# Patient Record
Sex: Male | Born: 1955 | Race: White | Hispanic: No | Marital: Married | Smoking: Never smoker
Health system: Southern US, Community
[De-identification: ages and names within clinical notes are randomized; demographics above are authoritative.]

## PROBLEM LIST (undated history)

## (undated) DIAGNOSIS — J301 Allergic rhinitis due to pollen: Secondary | ICD-10-CM

## (undated) DIAGNOSIS — I10 Essential (primary) hypertension: Secondary | ICD-10-CM

## (undated) DIAGNOSIS — K219 Gastro-esophageal reflux disease without esophagitis: Secondary | ICD-10-CM

## (undated) DIAGNOSIS — E782 Mixed hyperlipidemia: Secondary | ICD-10-CM

## (undated) HISTORY — DX: Essential (primary) hypertension: I10

## (undated) HISTORY — DX: Allergic rhinitis due to pollen: J30.1

## (undated) HISTORY — PX: TONSILLECTOMY: SUR1361

## (undated) HISTORY — DX: Mixed hyperlipidemia: E78.2

## (undated) HISTORY — DX: Gastro-esophageal reflux disease without esophagitis: K21.9

---

## 2006-02-22 ENCOUNTER — Ambulatory Visit: Payer: Self-pay | Admitting: Family Medicine

## 2006-04-10 ENCOUNTER — Ambulatory Visit: Payer: Self-pay | Admitting: Family Medicine

## 2007-02-25 ENCOUNTER — Ambulatory Visit: Payer: Self-pay | Admitting: Family Medicine

## 2007-02-25 DIAGNOSIS — K219 Gastro-esophageal reflux disease without esophagitis: Secondary | ICD-10-CM | POA: Insufficient documentation

## 2007-02-25 DIAGNOSIS — I10 Essential (primary) hypertension: Secondary | ICD-10-CM | POA: Insufficient documentation

## 2007-02-25 DIAGNOSIS — E782 Mixed hyperlipidemia: Secondary | ICD-10-CM | POA: Insufficient documentation

## 2007-02-25 HISTORY — DX: Essential (primary) hypertension: I10

## 2007-02-25 HISTORY — DX: Gastro-esophageal reflux disease without esophagitis: K21.9

## 2007-02-25 HISTORY — DX: Mixed hyperlipidemia: E78.2

## 2007-02-25 LAB — CONVERTED CEMR LAB
Glucose, Urine, Semiquant: NEGATIVE
Specific Gravity, Urine: 1.025
WBC Urine, dipstick: NEGATIVE
pH: 6

## 2007-03-12 ENCOUNTER — Encounter: Payer: Self-pay | Admitting: Family Medicine

## 2007-03-12 ENCOUNTER — Ambulatory Visit: Payer: Self-pay | Admitting: Family Medicine

## 2007-03-12 LAB — CONVERTED CEMR LAB
Albumin: 4.2 g/dL (ref 3.5–5.2)
BUN: 16 mg/dL (ref 6–23)
Calcium: 9.2 mg/dL (ref 8.4–10.5)
Chloride: 104 meq/L (ref 96–112)
Glucose, Bld: 94 mg/dL (ref 70–99)
HDL: 37 mg/dL — ABNORMAL LOW (ref >39)
PSA: 0.35 ng/mL (ref 0.10–4.00)
Potassium: 4.5 meq/L (ref 3.5–5.3)
Total Protein: 7 g/dL (ref 6.0–8.3)
Triglycerides: 204 mg/dL — ABNORMAL HIGH (ref <150)

## 2007-03-14 ENCOUNTER — Encounter: Payer: Self-pay | Admitting: Family Medicine

## 2007-05-20 ENCOUNTER — Ambulatory Visit: Payer: Self-pay | Admitting: Family Medicine

## 2007-05-24 ENCOUNTER — Encounter: Payer: Self-pay | Admitting: Family Medicine

## 2007-05-24 ENCOUNTER — Encounter: Admission: RE | Admit: 2007-05-24 | Discharge: 2007-05-24 | Payer: Self-pay | Admitting: Family Medicine

## 2007-06-11 ENCOUNTER — Telehealth: Payer: Self-pay | Admitting: Family Medicine

## 2007-08-05 ENCOUNTER — Telehealth: Payer: Self-pay | Admitting: *Deleted

## 2007-08-07 ENCOUNTER — Telehealth: Payer: Self-pay | Admitting: *Deleted

## 2007-09-03 ENCOUNTER — Encounter: Payer: Self-pay | Admitting: Family Medicine

## 2007-09-19 ENCOUNTER — Encounter: Payer: Self-pay | Admitting: Family Medicine

## 2008-03-02 ENCOUNTER — Telehealth: Payer: Self-pay | Admitting: Family Medicine

## 2008-03-03 ENCOUNTER — Ambulatory Visit: Payer: Self-pay | Admitting: Family Medicine

## 2009-03-01 ENCOUNTER — Telehealth: Payer: Self-pay | Admitting: Family Medicine

## 2009-03-22 ENCOUNTER — Ambulatory Visit: Payer: Self-pay | Admitting: Family Medicine

## 2009-03-22 DIAGNOSIS — N401 Enlarged prostate with lower urinary tract symptoms: Secondary | ICD-10-CM | POA: Insufficient documentation

## 2009-03-29 ENCOUNTER — Telehealth: Payer: Self-pay | Admitting: *Deleted

## 2009-03-31 ENCOUNTER — Ambulatory Visit: Payer: Self-pay | Admitting: Family Medicine

## 2009-03-31 LAB — CONVERTED CEMR LAB
ALT: 26 units/L (ref 0–53)
Albumin: 4.4 g/dL (ref 3.5–5.2)
BUN: 18 mg/dL (ref 6–23)
CO2: 25 meq/L (ref 19–32)
Calcium: 9.3 mg/dL (ref 8.4–10.5)
Chloride: 103 meq/L (ref 96–112)
Cholesterol: 174 mg/dL (ref 0–200)
Creatinine, Ser: 1.04 mg/dL (ref 0.40–1.50)
PSA: 0.31 ng/mL (ref 0.10–4.00)
Potassium: 4.4 meq/L (ref 3.5–5.3)
Total CHOL/HDL Ratio: 4.7

## 2009-04-05 ENCOUNTER — Encounter: Payer: Self-pay | Admitting: Family Medicine

## 2009-10-21 ENCOUNTER — Encounter: Payer: Self-pay | Admitting: Family Medicine

## 2009-11-17 ENCOUNTER — Ambulatory Visit: Payer: Self-pay | Admitting: Family Medicine

## 2009-12-15 ENCOUNTER — Ambulatory Visit: Payer: Self-pay | Admitting: Family Medicine

## 2010-04-06 ENCOUNTER — Ambulatory Visit: Payer: Self-pay | Admitting: Family Medicine

## 2010-04-06 LAB — CONVERTED CEMR LAB
ALT: 41 units/L (ref 0–53)
Albumin: 4.4 g/dL (ref 3.5–5.2)
CO2: 24 meq/L (ref 19–32)
Calcium: 9.1 mg/dL (ref 8.4–10.5)
Chloride: 102 meq/L (ref 96–112)
Cholesterol: 190 mg/dL (ref 0–200)
Glucose, Bld: 100 mg/dL — ABNORMAL HIGH (ref 70–99)
Potassium: 4.3 meq/L (ref 3.5–5.3)
Sodium: 138 meq/L (ref 135–145)
Total Bilirubin: 0.7 mg/dL (ref 0.3–1.2)
Total Protein: 6.9 g/dL (ref 6.0–8.3)
Triglycerides: 161 mg/dL — ABNORMAL HIGH (ref <150)

## 2010-04-07 ENCOUNTER — Encounter: Payer: Self-pay | Admitting: Family Medicine

## 2010-06-07 NOTE — Miscellaneous (Signed)
Summary: Scuba Diving applicatiosn  Clinical Lists Changes James Watts would like to have form completed no later than Monday.  He will pick up whn ready. James Watts  October 21, 2009 9:10 AM  form to pcp.Golden Circle RN  October 21, 2009 9:51 AM  Completed - Form to Fara Boros MD  October 21, 2009 2:41 PM  pt states he will come get it monday.placed up front .Golden Circle RN  October 21, 2009 3:51 PM

## 2010-06-07 NOTE — Assessment & Plan Note (Signed)
Summary: ear problem,tcb   Vital Signs:  Patient profile:   55 year old male Height:      71.5 inches Weight:      252.1 pounds BMI:     34.80 Temp:     98.2 degrees F oral Pulse rate:   73 / minute BP sitting:   127 / 76  (left arm) Cuff size:   regular  Vitals Entered By: Garen Grams LPN (December 15, 2009 4:21 PM) CC: still having right ear pain Is Patient Diabetic? No Pain Assessment Patient in pain? yes     Location: r ear   CC:  still having right ear pain.  History of Present Illness: 1) Right Ear Pain: Treated for acute otitis externa right ear 11/17/09 w/ corticosporin ear drops with improvement. Symptoms of ear pain and mild discharge returned a few days ago. Has been swimming frequently. Also dives but has not done so since June 2011.   ROS - Denies fever, chills, nausea, emesis, hearing loss.   PMH - Medications reviewed and updated in medication list.  Smoking Status noted in VS form    Habits & Providers  Alcohol-Tobacco-Diet     Tobacco Status: never  Medications Prior to Update: 1)  Finasteride 5 Mg Tabs (Finasteride) .... Take 1 Tablet By Mouth Once A Day 2)  Fluticasone Propionate 50 Mcg/act Susp (Fluticasone Propionate) .... Spray 2 Spray Into Both Nostrils Once A Day. 1 Mdi 3)  Lipitor 20 Mg Tabs (Atorvastatin Calcium) .... Take 1 Tablet By Mouth Once A Day 4)  Lisinopril 10 Mg Tabs (Lisinopril) .... Take 1 Tablet By Mouth Once A Day 5)  Viagra 50 Mg Tabs (Sildenafil Citrate) .... Take 1 Tablet By Mouth As Directed 6)  Omeprazole 20 Mg Cpdr (Omeprazole) .... 2 A Day 7)  Flovent Hfa 110 Mcg/act  Aero (Fluticasone Propionate  Hfa) .Marland Kitchen.. 1 Puff Two Times A Day Prior To Allergy Season 1 Mdi 8)  Albuterol 90 Mcg/act  Aers (Albuterol) .Marland Kitchen.. 1-2 Puff As Needed Do Not Use More Than 4 X /day 1 Mdi 9)  Aspirin 81 Mg Tabs (Aspirin) .Marland Kitchen.. 1 Daily 10)  Gnp Fish Oil 1000 Mg Caps (Omega-3 Fatty Acids) .Marland Kitchen.. 1 Daily 11)  Cortisporin-Tc 3.07-08-08-0.5 Mg/ml Susp  (Neomycin-Colist-Hc-Thonzonium) .... 2-3 Drops 4 X A Day To R Ear After Ear Cleaning 1 Bottle  Allergies (verified): 1)  Cefaclor (Cefaclor)  Physical Exam  General:  Well-developed,well-nourished,in no acute distress; alert,appropriate and cooperative throughout examination Ears:  L ear - normal R canal - erythematous and mildly swollen with whitish debris, mildly.  TM poorly seen but appears clear w/o perforation.    Impression & Recommendations:  Problem # 1:  OTITIS EXTERNA (ICD-380.10) Assessment Deteriorated  Will treat with drops as below. Follow up with PCP as needed. Ear care reviewed.   His updated medication list for this problem includes:    Cortisporin-tc 3.07-08-08-0.5 Mg/ml Susp (Neomycin-colist-hc-thonzonium) .Marland Kitchen... 2-3 drops 4 x a day to r ear after ear cleaning 1 bottle   Orders: [facility]- Est Level  3 ([address]562)  Orders: [facility]- Est Level  3 (13086)  Complete Medication List: 1)  Finasteride 5 Mg Tabs (Finasteride) .... Take 1 tablet by mouth once a day 2)  Fluticasone Propionate 50 Mcg/act Susp (Fluticasone propionate) .... Spray 2 spray into both nostrils once a day. 1 mdi 3)  Lipitor 20 Mg Tabs (Atorvastatin calcium) .... Take 1 tablet by mouth once a day 4)  Lisinopril 10 Mg Tabs (Lisinopril) .... Take  1 tablet by mouth once a day 5)  Viagra 50 Mg Tabs (Sildenafil citrate) .... Take 1 tablet by mouth as directed 6)  Omeprazole 20 Mg Cpdr (Omeprazole) .... 2 a day 7)  Flovent Hfa 110 Mcg/act Aero (Fluticasone propionate  hfa) .Marland Kitchen.. 1 puff two times a day prior to allergy season 1 mdi 8)  Albuterol 90 Mcg/act Aers (Albuterol) .Marland Kitchen.. 1-2 puff as needed do not use more than 4 x /day 1 mdi 9)  Aspirin 81 Mg Tabs (Aspirin) .Marland Kitchen.. 1 daily 10)  Gnp Fish Oil 1000 Mg Caps (Omega-3 fatty acids) .Marland Kitchen.. 1 daily 11)  Cortisporin-tc 3.07-08-08-0.5 Mg/ml Susp (Neomycin-colist-hc-thonzonium) .... 2-3 drops 4 x a day to r ear after ear cleaning 1 bottle

## 2010-06-07 NOTE — Assessment & Plan Note (Signed)
Summary: cpe,df   Vital Signs:  Patient profile:   55 year old male Height:      71.5 inches Weight:      252.13 pounds BMI:     34.80 BSA:     2.34 Temp:     98.6 degrees F Pulse rate:   75 / minute BP sitting:   128 / 78  Vitals Entered By: Jone Baseman CMA (April 06, 2010 8:37 AM) CC: CPE Is Patient Diabetic? No Pain Assessment Patient in pain? no        CC:  CPE.  History of Present Illness: CPE feels well no complaints  ROS - as above PMH - Medications reviewed and updated in medication list.  Smoking Status noted in VS form    Habits & Providers  Alcohol-Tobacco-Diet     Tobacco Status: never  Current Medications (verified): 1)  Finasteride 5 Mg Tabs (Finasteride) .... Take 1 Tablet By Mouth Once A Day 2)  Fluticasone Propionate 50 Mcg/act Susp (Fluticasone Propionate) .... Spray 2 Spray Into Both Nostrils Once A Day. 1 Mdi 3)  Lipitor 20 Mg Tabs (Atorvastatin Calcium) .... Take 1 Tablet By Mouth Once A Day 4)  Lisinopril 10 Mg Tabs (Lisinopril) .... Take 1 Tablet By Mouth Once A Day 5)  Viagra 50 Mg Tabs (Sildenafil Citrate) .... Take 1 Tablet By Mouth As Directed 6)  Omeprazole 20 Mg Cpdr (Omeprazole) .... 2 A Day 7)  Flovent Hfa 110 Mcg/act  Aero (Fluticasone Propionate  Hfa) .Marland Kitchen.. 1 Puff Two Times A Day Prior To Allergy Season 1 Mdi 8)  Albuterol 90 Mcg/act  Aers (Albuterol) .Marland Kitchen.. 1-2 Puff As Needed Do Not Use More Than 4 X /day 1 Mdi 9)  Aspirin 81 Mg Tabs (Aspirin) .Marland Kitchen.. 1 Daily 10)  Gnp Fish Oil 1000 Mg Caps (Omega-3 Fatty Acids) .Marland Kitchen.. 1 Daily  Allergies: 1)  Cefaclor (Cefaclor)  Review of Systems  The patient denies anorexia, fever, weight loss, vision loss, decreased hearing, hoarseness, chest pain, syncope, dyspnea on exertion, peripheral edema, prolonged cough, headaches, hemoptysis, abdominal pain, melena, hematochezia, severe indigestion/heartburn, hematuria, incontinence, genital sores, muscle weakness, suspicious skin lesions, transient  blindness, difficulty walking, depression, unusual weight change, abnormal bleeding, enlarged lymph nodes, angioedema, and testicular masses.    Physical Exam  General:  Well-developed,well-nourished,in no acute distress; alert,appropriate and cooperative throughout examination Head:  Normocephalic and atraumatic without obvious abnormalities. No apparent alopecia or balding. Ears:  External ear exam shows no significant lesions or deformities.  Otoscopic examination reveals clear canals, tympanic membranes are intact bilaterally without bulging, retraction, inflammation or discharge. Hearing is grossly normal bilaterally. Mouth:  Oral mucosa and oropharynx without lesions or exudates.  Teeth in good repair. Neck:  No deformities, masses, or tenderness noted. Lungs:  Normal respiratory effort, chest expands symmetrically. Lungs are clear to auscultation, no crackles or wheezes. Heart:  Normal rate and regular rhythm. S1 and S2 normal without gallop, murmur, click, rub or other extra sounds. Abdomen:  Bowel sounds positive,abdomen soft and non-tender without masses, organomegaly or hernias noted. Rectal:  No external abnormalities noted. Normal sphincter tone. No rectal masses or tenderness. Prostate:  Prostate gland firm and smooth, no enlargement, nodularity, tenderness, mass, asymmetry or induration. Msk:  No deformity or scoliosis noted of thoracic or lumbar spine.   Extremities:  No clubbing, cyanosis, edema, or deformity noted with normal full range of motion of all joints.   Skin:  Intact without suspicious lesions or rashes Cervical Nodes:  No lymphadenopathy noted  Psych:  Cognition and judgment appear intact. Alert and cooperative with normal attention span and concentration. No apparent delusions, illusions, hallucinations   Impression & Recommendations:  Problem # 1:  PREVENTIVE HEALTH CARE (ICD-V70.0)  normal exam.  up to date on screening  Orders: [facility] - Est  40-64 yrs  ([address])  Problem # 2:  GERD (ICD-530.1) Assessment: Unchanged stable.  No red flags of bleeding or dysphagia or weight loss   Problem # 3:  HYPERTENSION (ICD-401.9)  stable  His updated medication list for this problem includes:    Lisinopril 10 Mg Tabs (Lisinopril) .Marland Kitchen... Take 1 tablet by mouth once a day  Orders: Comp Met-[facility] ([id number])  BP today: 128/78 Prior BP: 127/76 (12/15/2009)  Labs Reviewed: K+: 4.4 (03/31/2009) Creat: : 1.04 (03/31/2009)   Chol: 174 (03/31/2009)   HDL: 37 (03/31/2009)   LDL: 102 (03/31/2009)   TG: 177 (03/31/2009)  Problem # 4:  HYPERLIPIDEMIA (ICD-272.2) check labs  His updated medication list for this problem includes:    Lipitor 20 Mg Tabs (Atorvastatin calcium) .Marland Kitchen... Take 1 tablet by mouth once a day  Orders: Lipid-[facility] ([id number])  Problem # 5:  HYPERTROPHY PROSTATE W/UR OBST & OTH LUTS (ICD-600.01) stable symptoms check psa  Complete Medication List: 1)  Finasteride 5 Mg Tabs (Finasteride) .... Take 1 tablet by mouth once a day 2)  Fluticasone Propionate 50 Mcg/act Susp (Fluticasone propionate) .... Spray 2 spray into both nostrils once a day. 1 mdi 3)  Lipitor 20 Mg Tabs (Atorvastatin calcium) .... Take 1 tablet by mouth once a day 4)  Lisinopril 10 Mg Tabs (Lisinopril) .... Take 1 tablet by mouth once a day 5)  Viagra 50 Mg Tabs (Sildenafil citrate) .... Take 1 tablet by mouth as directed 6)  Omeprazole 20 Mg Cpdr (Omeprazole) .... 2 a day 7)  Flovent Hfa 110 Mcg/act Aero (Fluticasone propionate  hfa) .Marland Kitchen.. 1 puff two times a day prior to allergy season 1 mdi 8)  Albuterol 90 Mcg/act Aers (Albuterol) .Marland Kitchen.. 1-2 puff as needed do not use more than 4 x /day 1 mdi 9)  Aspirin 81 Mg Tabs (Aspirin) .Marland Kitchen.. 1 daily 10)  Gnp Fish Oil 1000 Mg Caps (Omega-3 fatty acids) .Marland Kitchen.. 1 daily  Other Orders: PSA-[facility] ([id number])  Patient Instructions: 1)  Please schedule a follow-up appointment in 1 year.  2)  I will call you if your lab is abnormal  otherwise I will send you a letter within 2 weeks. 3)  You major health task is weight control.  I agree portion control and exercise are the keys.   Aim to lose 2 lb per week.   If you would like to meet with our Health Coach or Nutritiionist please contact us Prescriptions: LISINOPRIL 10 MG TABS (LISINOPRIL) Take 1 tablet by mouth once a day  #90 x 3   Entered and Authorized by:   Pearlean Brownie MD   Signed by:   Pearlean Brownie MD on 04/06/2010   Method used:   Faxed to ...       [facility] (mail-order)             , [place]         Ph: [phone]       Fax: [phone]   RxID:   [id number] LIPITOR 20 MG TABS (ATORVASTATIN CALCIUM) Take 1 tablet by mouth once a day  #90 x 3   Entered and Authorized by:   Pearlean Brownie MD   Signed by:   Gaynell Face  Tinea Nobile MD on 04/06/2010   Method used:   Faxed to ...       [facility] (mail-order)             , [place]         Ph: [phone]       Fax: [phone]   RxID:   [id number] FINASTERIDE 5 MG TABS (FINASTERIDE) Take 1 tablet by mouth once a day  #14 x 0   Entered and Authorized by:   Pearlean Brownie MD   Signed by:   Pearlean Brownie MD on 04/06/2010   Method used:   [facility]        [facility] [id number]       [address]       [place], [place]  [address]       Ph: [phone] or [phone]       Fax: [phone]   RxID:   [id number]    Orders Added: 1)  Comp Met-[facility] [[id number]] 2)  Lipid-[facility] [[id number]] 3)  PSA-[facility] [phone] 4)  [facility] - Est  40-64 yrs [[id number]]     Prevention & Chronic Care Immunizations   Influenza vaccine: Fluvax 3+  (03/22/2009)    Tetanus booster: 03/22/2009: Tdap    Pneumococcal vaccine: Not documented  Colorectal Screening   Hemoccult: Not documented   Hemoccult action/deferral: Not indicated  (03/22/2009)    Colonoscopy: 8 milm polyp   (09/19/2007)  Other Screening   PSA: 0.31  (03/31/2009)   PSA ordered.   Smoking status: never   (04/06/2010)  Lipids   Total Cholesterol: 174  (03/31/2009)   LDL: 102  (03/31/2009)   LDL Direct: Not documented   HDL: 37  (03/31/2009)   Triglycerides: 177  (03/31/2009)    SGOT (AST): 20  (03/31/2009)   SGPT (ALT): 26  (03/31/2009) CMP ordered    Alkaline phosphatase: 70  (03/31/2009)   Total bilirubin: 0.6  (03/31/2009)    Lipid flowsheet reviewed?: Yes   Progress toward LDL goal: At goal  Hypertension   Last Blood Pressure: 128 / 78  (04/06/2010)   Serum creatinine: 1.04  (03/31/2009)   Serum potassium 4.4  (03/31/2009) CMP ordered     Hypertension flowsheet reviewed?: Yes   Progress toward BP goal: At goal  Self-Management Support :    Hypertension self-management support: Not documented    Lipid self-management support: Not documented     Appended Document: cpe,df   Influenza Vaccine    Vaccine Type: Fluvax 3+    Site: left deltoid    Mfr: [employer]    Dose: 0.5 ml    Route: IM    Given by: Garen Grams LPN    Exp. Date: 11/02/2010    Lot #: [id number]    VIS given: 11/30/09 version given April 06, 2010.  Flu Vaccine Consent Questions    Do you have a history of severe allergic reactions to this vaccine? no    Any prior history of allergic reactions to egg and/or gelatin? no    Do you have a sensitivity to the preservative Thimersol? no    Do you have a past history of Guillan-Barre Syndrome? no    Do you currently have an acute febrile illness? no    Have you ever had a severe reaction to latex? no    Vaccine information given and explained to patient? yes

## 2010-06-07 NOTE — Assessment & Plan Note (Signed)
Summary: ear ache/eo   Vital Signs:  Patient profile:   55 year old male Height:      71.5 inches Weight:      251 pounds BMI:     34.64 BSA:     2.34 Temp:     98.2 degrees F Pulse rate:   79 / minute BP sitting:   131 / 83  Vitals Entered By: Jone Baseman, CMA (November 17, 2009 10:57 AM) CC: earache x 2 weeks Is Patient Diabetic? No Pain Assessment Patient in pain? yes     Location: right ear Intensity: 8   CC:  earache x 2 weeks.  History of Present Illness: Ear Pain started 2 weeks ago with fullness and some pain.  Used over the counter ear drops and then started by mouth amoxicillin 5 days ago.  Now pain is better but having discharge and fullness continues. has been swimming and diving frequently  ROS - as above PMH - Medications reviewed and updated in medication list.  Smoking Status noted in VS form    Habits & Providers  Alcohol-Tobacco-Diet     Tobacco Status: never  Current Medications (verified): 1)  Finasteride 5 Mg Tabs (Finasteride) .... Take 1 Tablet By Mouth Once A Day 2)  Fluticasone Propionate 50 Mcg/act Susp (Fluticasone Propionate) .... Spray 2 Spray Into Both Nostrils Once A Day. 1 Mdi 3)  Lipitor 20 Mg Tabs (Atorvastatin Calcium) .... Take 1 Tablet By Mouth Once A Day 4)  Lisinopril 10 Mg Tabs (Lisinopril) .... Take 1 Tablet By Mouth Once A Day 5)  Viagra 50 Mg Tabs (Sildenafil Citrate) .... Take 1 Tablet By Mouth As Directed 6)  Omeprazole 20 Mg Cpdr (Omeprazole) .... 2 A Day 7)  Flovent Hfa 110 Mcg/act  Aero (Fluticasone Propionate  Hfa) .Marland Kitchen.. 1 Puff Two Times A Day Prior To Allergy Season 1 Mdi 8)  Albuterol 90 Mcg/act  Aers (Albuterol) .Marland Kitchen.. 1-2 Puff As Needed Do Not Use More Than 4 X /day 1 Mdi 9)  Aspirin 81 Mg Tabs (Aspirin) .Marland Kitchen.. 1 Daily 10)  Gnp Fish Oil 1000 Mg Caps (Omega-3 Fatty Acids) .Marland Kitchen.. 1 Daily 11)  Cortisporin-Tc 3.07-08-08-0.5 Mg/ml Susp (Neomycin-Colist-Hc-Thonzonium) .... 2-3 Drops 4 X A Day To R Ear After Ear Cleaning 1  Bottle  Allergies: 1)  Cefaclor (Cefaclor)  Physical Exam  General:  Well-developed,well-nourished,in no acute distress; alert,appropriate and cooperative throughout examination Ears:  L ear - normal R canal - swollen with debris not tender.  TM poorly seen but appears clear  Nose:  External nasal examination shows no deformity or inflammation. Nasal mucosa are pink and moist without lesions or exudates.   Impression & Recommendations:  Problem # 1:  OTITIS EXTERNA (ICD-380.10)  Demonstrated ear wicking and will use drops.  No signs of otm but should finish antibiotics.  Told not to dive until resolved   His updated medication list for this problem includes:    Cortisporin-tc 3.07-08-08-0.5 Mg/ml Susp (Neomycin-colist-hc-thonzonium) .Marland Kitchen... 2-3 drops 4 x a day to r ear after ear cleaning 1 bottle  Orders: [facility]- Est Level  3 ([address])  Complete Medication List: 1)  Finasteride 5 Mg Tabs (Finasteride) .... Take 1 tablet by mouth once a day 2)  Fluticasone Propionate 50 Mcg/act Susp (Fluticasone propionate) .... Spray 2 spray into both nostrils once a day. 1 mdi 3)  Lipitor 20 Mg Tabs (Atorvastatin calcium) .... Take 1 tablet by mouth once a day 4)  Lisinopril 10 Mg Tabs (Lisinopril) .... Take 1  tablet by mouth once a day 5)  Viagra 50 Mg Tabs (Sildenafil citrate) .... Take 1 tablet by mouth as directed 6)  Omeprazole 20 Mg Cpdr (Omeprazole) .... 2 a day 7)  Flovent Hfa 110 Mcg/act Aero (Fluticasone propionate  hfa) .Marland Kitchen.. 1 puff two times a day prior to allergy season 1 mdi 8)  Albuterol 90 Mcg/act Aers (Albuterol) .Marland Kitchen.. 1-2 puff as needed do not use more than 4 x /day 1 mdi 9)  Aspirin 81 Mg Tabs (Aspirin) .Marland Kitchen.. 1 daily 10)  Gnp Fish Oil 1000 Mg Caps (Omega-3 fatty acids) .Marland Kitchen.. 1 daily 11)  Cortisporin-tc 3.07-08-08-0.5 Mg/ml Susp (Neomycin-colist-hc-thonzonium) .... 2-3 drops 4 x a day to r ear after ear cleaning 1 bottle  Patient Instructions: 1)  clean the ear and use the drops 4 x a  day 2)  The ear should start to clear up after 3-4 days.  Don't use the drops more than 10 days 3)  If you get fever, or severe ear pain or is not better in 2 weeks then call us Prescriptions: CORTISPORIN-TC 3.07-08-08-0.5 MG/ML SUSP (NEOMYCIN-COLIST-HC-THONZONIUM) 2-3 drops 4 x a day to R ear after ear cleaning 1 bottle  #1 x 0   Entered and Authorized by:   Pearlean Brownie MD   Signed by:   Pearlean Brownie MD on 11/17/2009   Method used:   [facility]        [facility] ([facility]ail[facility]       [address]       [place], [place]  [address]       Ph: [phone] or [phone]       Fax: [phone]   RxID:   [id number]

## 2010-06-07 NOTE — Letter (Signed)
Summary: Generic Letter  [facility]  [address]   [place], [place] [address]   Phone: [phone]  Fax: [phone]    04/07/2010  MUREL SHENBERGER [address] [place], [place]  [address]  Dear Mr. WREDE,  Your blood tests are all ok except your cholesterol which is creeping up.   Your diet and weight management should help with these.   Call with any questions.  Have a good year.  Patient: James Watts Note: All result statuses are Final unless otherwise noted.  Tests: (1) CMP with Estimated GFR (2402)   Sodium                    138 mEq/L                   135-145   Potassium                 4.3 mEq/L                   3.5-5.3   Chloride                  102 mEq/L                   96-112   CO2                       24 mEq/L                    19-32   Glucose              [H]  100 mg/dL                   84-16   BUN                       15 mg/dL                    6-06   Creatinine                1.08 mg/dL                  0.40-1.50   Bilirubin, Total          0.7 mg/dL                   3.0-1.6   Alkaline Phosphatase      86 U/L                      39-117   AST/SGOT                  28 U/L                      0-37   ALT/SGPT                  41 U/L                      0-53   Total Protein             6.9 g/dL                    0.1-0.9   Albumin  4.4 g/dL                    1.0-2.7   Calcium                   9.1 mg/dL                   2.5-36.6 ! Est GFR, African American                             >60 mL/min                  >60 ! Est GFR, NonAfrican American                             >60 mL/min                  >60  Tests: (2) Lipid Profile ([address])   Cholesterol               190 mg/dL                   7-425     ATP III Classification:           < 200        mg/dL        Desirable          200 - 239     mg/dL        Borderline High          >= 240        mg/dL        High         Triglyceride         [H]   161 mg/dL                   <956   HDL Cholesterol           40 mg/dL                    >38   Total Chol/HDL Ratio      4.8 Ratio  VLDL Cholesterol (Calc)                             32 mg/dL                    7-56  LDL Cholesterol (Calc)                        [H]  118 mg/dL                   4-33           Total Cholesterol/HDL Ratio:CHD Risk                            Coronary Heart Disease Risk Table                                            Men       Women  1/2 Average Risk              3.4        3.3                  Average Risk              5.0        4.4              2 X Average Risk              9.6        7.1              3 X Average Risk             23.4       11.0     Use the calculated Patient Ratio above and the CHD Risk table      to determine the patient's CHD Risk.     ATP III Classification (LDL):           < 100        mg/dL         Optimal          100 - 129     mg/dL         Near or Above Optimal          130 - 159     mg/dL         Borderline High          160 - 189     mg/dL         High           > 190        mg/dL         Very High        Tests: (3) PSA ([address])   PSA                       0.44 ng/mL                  <=4.00   Sincerely,   Pearlean Brownie MD   Appended Document: Generic Letter mailed

## 2010-07-01 ENCOUNTER — Telehealth: Payer: Self-pay | Admitting: Family Medicine

## 2010-07-01 MED ORDER — ATORVASTATIN CALCIUM 20 MG PO TABS
20.0000 mg | ORAL_TABLET | Freq: Every day | ORAL | Status: DC
Start: 2010-07-01 — End: 2010-10-10

## 2010-07-01 NOTE — Telephone Encounter (Signed)
Filed out fax form and placed to be faxed back

## 2010-07-01 NOTE — Telephone Encounter (Signed)
Patient would like to get Lipitor through this company (gets it at a discount).   According to rep all they need is a verbal order or a script faxed to [phone].  Told her I would send this note to his PCP.

## 2010-07-01 NOTE — Telephone Encounter (Signed)
Checking status of rx requesting for lipitor

## 2010-10-10 ENCOUNTER — Other Ambulatory Visit: Payer: Self-pay | Admitting: Family Medicine

## 2010-10-10 MED ORDER — ATORVASTATIN CALCIUM 20 MG PO TABS: 20.0000 mg | ORAL_TABLET | Freq: Every day | ORAL | Status: DC

## 2010-10-10 NOTE — Telephone Encounter (Signed)
Pt called his ins company & was informed lipitor is now available in generic as of 6/1, pt asking for MD to send in new Rx for this to [facility].

## 2010-10-23 ENCOUNTER — Other Ambulatory Visit: Payer: Self-pay | Admitting: Family Medicine

## 2010-10-24 NOTE — Telephone Encounter (Signed)
Refill request

## 2010-10-25 ENCOUNTER — Other Ambulatory Visit: Payer: Self-pay | Admitting: Family Medicine

## 2010-10-25 NOTE — Telephone Encounter (Signed)
Pt would like to change his medication lipitor to the generic, was told by pharmacy they need a new rx, pt uses [facility] & would like 3 month supply

## 2010-10-25 NOTE — Telephone Encounter (Signed)
Pls let him know I sent an Rx for generic lipitor on 6-4 to [facility]   Thanks  LC

## 2010-10-25 NOTE — Telephone Encounter (Signed)
Patient informed. 

## 2010-11-28 ENCOUNTER — Telehealth: Payer: Self-pay | Admitting: Family Medicine

## 2010-11-28 MED ORDER — SILDENAFIL CITRATE 50 MG PO TABS: 50.0000 mg | ORAL_TABLET | Freq: Every day | ORAL | Status: DC | PRN

## 2010-11-28 NOTE — Telephone Encounter (Signed)
Pt called to see what the problem is with his Lipator rx. He called in June and never rec'd the meds.  He needed generic for it so it would be cheaper.  pls advise.   He is also asking for refill on Viagra.  [facility] pharm.

## 2010-11-28 NOTE — Telephone Encounter (Signed)
I called [facility] pharmacy and they never received  Rx on generic lipitor. Gave rx to pharmacist over the phone  that Dr. Deirdre Priest sent in 10/10/2010.    also will  ask Dr. Deirdre Priest about sending in Rx for Viagra.

## 2010-11-28 NOTE — Telephone Encounter (Addendum)
Rx for viagra sent to [facility] Please notify him  Thanks

## 2010-11-28 NOTE — Telephone Encounter (Signed)
Pt infomred [facility], [employer]

## 2011-03-14 ENCOUNTER — Other Ambulatory Visit: Payer: Self-pay | Admitting: Family Medicine

## 2011-03-14 NOTE — Telephone Encounter (Signed)
Refill request

## 2011-03-22 ENCOUNTER — Ambulatory Visit (INDEPENDENT_AMBULATORY_CARE_PROVIDER_SITE_OTHER): Payer: 59 | Admitting: Family Medicine

## 2011-03-22 ENCOUNTER — Encounter: Payer: Self-pay | Admitting: Family Medicine

## 2011-03-22 VITALS — BP 130/80 | HR 73 | Temp 98.0°F | Ht 71.0 in | Wt 251.1 lb

## 2011-03-22 DIAGNOSIS — I10 Essential (primary) hypertension: Secondary | ICD-10-CM

## 2011-03-22 DIAGNOSIS — Z23 Encounter for immunization: Secondary | ICD-10-CM

## 2011-03-22 DIAGNOSIS — Z79899 Other long term (current) drug therapy: Secondary | ICD-10-CM

## 2011-03-22 DIAGNOSIS — K209 Esophagitis, unspecified without bleeding: Secondary | ICD-10-CM

## 2011-03-22 DIAGNOSIS — Z125 Encounter for screening for malignant neoplasm of prostate: Secondary | ICD-10-CM

## 2011-03-22 DIAGNOSIS — J301 Allergic rhinitis due to pollen: Secondary | ICD-10-CM

## 2011-03-22 DIAGNOSIS — E782 Mixed hyperlipidemia: Secondary | ICD-10-CM

## 2011-03-22 HISTORY — DX: Allergic rhinitis due to pollen: J30.1

## 2011-03-22 LAB — CBC WITH DIFFERENTIAL/PLATELET
Basophils Absolute: 0 10*3/uL (ref 0.0–0.1)
HCT: 47.5 % (ref 39.0–52.0)
Hemoglobin: 16.2 g/dL (ref 13.0–17.0)
Lymphs Abs: 2.6 10*3/uL (ref 0.7–4.0)
MCV: 90.5 fl (ref 78.0–100.0)
Monocytes Absolute: 0.8 10*3/uL (ref 0.1–1.0)
Monocytes Relative: 7.8 % (ref 3.0–12.0)
Neutro Abs: 6.1 10*3/uL (ref 1.4–7.7)
Platelets: 236 10*3/uL (ref 150.0–400.0)
RDW: 13.5 % (ref 11.5–14.6)

## 2011-03-22 LAB — HEPATIC FUNCTION PANEL
ALT: 29 U/L (ref 0–53)
AST: 24 U/L (ref 0–37)
Albumin: 4.3 g/dL (ref 3.5–5.2)
Total Bilirubin: 1.1 mg/dL (ref 0.3–1.2)

## 2011-03-22 LAB — LIPID PANEL
Cholesterol: 174 mg/dL (ref 0–200)
Triglycerides: 211 mg/dL — ABNORMAL HIGH (ref 0.0–149.0)
VLDL: 42.2 mg/dL — ABNORMAL HIGH (ref 0.0–40.0)

## 2011-03-22 LAB — BASIC METABOLIC PANEL
BUN: 16 mg/dL (ref 6–23)
CO2: 32 mEq/L (ref 19–32)
Chloride: 100 mEq/L (ref 96–112)
GFR: 81.64 mL/min (ref >60.00)
Glucose, Bld: 79 mg/dL (ref 70–99)
Potassium: 4.3 mEq/L (ref 3.5–5.1)
Sodium: 140 mEq/L (ref 135–145)

## 2011-03-22 NOTE — Progress Notes (Signed)
Patient Name: James Watts Date of Birth: 14-Mar-1956 Age: 55 y.o. Medical Record Number: [id number] Gender: male  History of Present Illness:  James Watts is a 55 y.o. very pleasant male patient who presents with the following:  1. HYPERTENSION stable now  Basic metabolic panel  2. Flu vaccine need  Flu vaccine greater than or equal to 3yo preservative free IM  3. HYPERLIPIDEMIA stable on lipitor Lipid panel  4. Encounter for long-term (current) use of other medications  CBC with Differential, Hepatic function panel  5. Special screening for malignant neoplasm of prostate  PSA  6. Allergic rhinitis due to pollen - stable and using flonase prn   7. GERD : omeprazole daily     Tobacco History Reviewed. Alcohol: No concerns, no excessive use Exercise Habits: Some activity, rec at least 30 mins 5 times a week STD concerns: no risk or activity to increase risk Drug Use: None Encouraged self-testicular check  Health Maintenance  Topic Date Due  . Colonoscopy  05/08/2010  . Influenza Vaccine  02/06/2012  . Tetanus/tdap  03/23/2019   Patient Active Problem List  Diagnoses  . HYPERLIPIDEMIA  . HYPERTENSION  . GERD  . Allergic rhinitis due to pollen   No past medical history on file. No past surgical history on file. History  Substance Use Topics  . Smoking status: Never Smoker   . Smokeless tobacco: Not on file  . Alcohol Use: Yes   No family history on file. Allergies  Allergen Reactions  . Cefaclor     REACTION: unspecified   Current Outpatient Prescriptions on File Prior to Visit  Medication Sig Dispense Refill  . albuterol (PROVENTIL,VENTOLIN) 90 MCG/ACT inhaler Inhale 1-2 puffs into the lungs as needed. Do not use more than 4 x /day       . aspirin 81 MG tablet Take 81 mg by mouth daily.        Marland Kitchen atorvastatin (LIPITOR) 20 MG tablet Take 1 tablet (20 mg total) by mouth daily.  90 tablet  3  . fish oil-omega-3 fatty acids 1000 MG capsule Take 1 g by mouth  daily.        . fluticasone (FLONASE) 50 MCG/ACT nasal spray Place 2 sprays into the nose daily as needed.       Marland Kitchen lisinopril (PRINIVIL,ZESTRIL) 10 MG tablet TAKE 1 TABLET DAILY  90 tablet  3  . omeprazole (PRILOSEC) 20 MG capsule Take 2 capsules (40 mg total) by mouth daily.  180 capsule  3  . sildenafil (VIAGRA) 50 MG tablet Take 1 tablet (50 mg total) by mouth daily as needed.  30 tablet  3     Review of Systems:  General: Denies fever, chills, sweats. No significant weight loss. Eyes: Denies blurring,significant itching ENT: Denies earache, sore throat, and hoarseness. Cardiovascular: Denies chest pains, palpitations, dyspnea on exertion Respiratory: Denies cough, dyspnea at rest,wheeezing Breast: no concerns about lumps GI: Denies nausea, vomiting, diarrhea, constipation, change in bowel habits, abdominal pain, melena, hematochezia GU: Denies penile discharge, ED, urinary flow / outflow problems. No STD concerns. Musculoskeletal: Denies back pain, joint pain Derm: Denies rash, itching Neuro: Denies  paresthesias, frequent falls, frequent headaches Psych: Denies depression, anxiety Endocrine: Denies cold intolerance, heat intolerance, polydipsia Heme: Denies enlarged lymph nodes Allergy: No hayfever   Physical Examination: Filed Vitals:   03/22/11 1358  BP: 130/80  Pulse: 73  Temp: 98 F (36.7 C)  TempSrc: Oral  Height: 5\' 11"  (1.803 m)  Weight: 251 lb 1.9  oz (113.907 kg)  SpO2: 98%     GEN: well developed, well nourished, no acute distress Eyes: conjunctiva and lids normal, PERRLA, EOMI ENT: TM clear, nares clear, oral exam WNL Neck: supple, no lymphadenopathy, no thyromegaly, no JVD Pulm: clear to auscultation and percussion, respiratory effort normal CV: regular rate and rhythm, S1-S2, no murmur, rub or gallop, no bruits, peripheral pulses normal and symmetric, no cyanosis, clubbing, edema or varicosities Chest: no scars, masses GI: soft, non-tender; no  hepatosplenomegaly, masses; active bowel sounds all quadrants GU: no hernia, testicular mass, penile discharge, or prostate enlargement Lymph: no cervical, axillary or inguinal adenopathy MSK: gait normal, muscle tone and strength WNL, no joint swelling, effusions, discoloration, crepitus  SKIN: clear, good turgor, color WNL, no rashes, lesions, or ulcerations Neuro: normal mental status, normal strength, sensation, and motion Psych: alert; oriented to person, place and time, normally interactive and not anxious or depressed in appearance.   Assessment and Plan:  1. HYPERTENSION  Basic metabolic panel  2. Flu vaccine need  Flu vaccine greater than or equal to 3yo preservative free IM  3. HYPERLIPIDEMIA  Lipid panel  4. Encounter for long-term (current) use of other medications  CBC with Differential, Hepatic function panel  5. Special screening for malignant neoplasm of prostate  PSA  6. Allergic rhinitis due to pollen    7. GERD     Will check all labs, can refill meds for 1 year. O/w doing ok He is going to call [facility] to check about repeat colon  Orders Placed This Encounter  Procedures  . Flu vaccine greater than or equal to 3yo preservative free IM  . Basic metabolic panel  . CBC with Differential  . Hepatic function panel  . Lipid panel  . PSA    Current Outpatient Prescriptions on File Prior to Visit  Medication Sig Dispense Refill  . albuterol (PROVENTIL,VENTOLIN) 90 MCG/ACT inhaler Inhale 1-2 puffs into the lungs as needed. Do not use more than 4 x /day       . aspirin 81 MG tablet Take 81 mg by mouth daily.        Marland Kitchen atorvastatin (LIPITOR) 20 MG tablet Take 1 tablet (20 mg total) by mouth daily.  90 tablet  3  . fish oil-omega-3 fatty acids 1000 MG capsule Take 1 g by mouth daily.        . fluticasone (FLONASE) 50 MCG/ACT nasal spray Place 2 sprays into the nose daily as needed.       Marland Kitchen lisinopril (PRINIVIL,ZESTRIL) 10 MG tablet TAKE 1 TABLET DAILY  90 tablet  3  .  omeprazole (PRILOSEC) 20 MG capsule Take 2 capsules (40 mg total) by mouth daily.  180 capsule  3  . sildenafil (VIAGRA) 50 MG tablet Take 1 tablet (50 mg total) by mouth daily as needed.  30 tablet  3    Medications Discontinued During This Encounter  Medication Reason  . finasteride (PROSCAR) 5 MG tablet   . fluticasone (FLOVENT HFA) 110 MCG/ACT inhaler

## 2011-04-10 ENCOUNTER — Encounter: Payer: Self-pay | Admitting: Family Medicine

## 2011-04-10 ENCOUNTER — Ambulatory Visit (INDEPENDENT_AMBULATORY_CARE_PROVIDER_SITE_OTHER): Payer: 59 | Admitting: Family Medicine

## 2011-04-10 ENCOUNTER — Telehealth: Payer: Self-pay | Admitting: Internal Medicine

## 2011-04-10 VITALS — BP 130/80 | HR 75 | Temp 98.6°F | Ht 71.0 in | Wt 252.1 lb

## 2011-04-10 DIAGNOSIS — J3489 Other specified disorders of nose and nasal sinuses: Secondary | ICD-10-CM

## 2011-04-10 MED ORDER — AMOXICILLIN-POT CLAVULANATE 875-125 MG PO TABS: 1.0000 | ORAL_TABLET | Freq: Two times a day (BID) | ORAL | Status: AC

## 2011-04-10 NOTE — Telephone Encounter (Signed)
Patient called and is going out of town tomorrow and was wondering if you could call in an antiobiotic.  He has a sinus infection nasal drip and sore throat.  His wife starts chemo treatment soon and wanted to get well before she starts chemo.  Please advise.

## 2011-04-10 NOTE — Progress Notes (Signed)
  Patient Name: James Watts Date of Birth: 03-29-1956 Age: 55 y.o. Medical Record Number: [id number] Gender: male  History of Present Illness:  James Watts is a 55 y.o. very pleasant male patient who presents with the following:  Sinus cong / drainage - wife with colon cancer  Sinus Pain: Patient complains of bilateral ear pressure/pain, achiness, congestion, cough described as nonproductive, facial pain, lightheadedness, low grade fever, nasal congestion, post nasal drip, purulent nasal discharge, sinus pressure, sneezing, sore throat and tooth pain. Symptoms include above with no fever, chills, night sweats or weight loss. Onset of symptoms was 5 days ago, unchanged since that time. He is drinking plenty of fluids.  Past history is significant for wife with Stage 3c colon cancer, currently undergoing chemo. Patient is non-smoker  Past Medical History, Surgical History, Social History, Family History, and Problem List have been reviewed in EHR and updated if relevant.  Review of Systems: ROS: GEN: Acute illness details above GI: Tolerating PO intake GU: maintaining adequate hydration and urination Pulm: No SOB Interactive and getting along well at home.  Otherwise, ROS is as per the HPI.   Physical Examination: Filed Vitals:   04/10/11 1442  BP: 130/80  Pulse: 75  Temp: 98.6 F (37 C)  TempSrc: Oral  Height: 5\' 11"  (1.803 m)  Weight: 252 lb 1.9 oz (114.361 kg)  SpO2: 97%    Body mass index is 35.16 kg/(m^2).   Gen: WDWN, NAD; alert,appropriate and cooperative throughout exam  HEENT: Normocephalic and atraumatic. Throat clear, w/o exudate, no LAD, R TM clear, L TM - good landmarks, No fluid present. rhinnorhea.  Left frontal and maxillary sinuses:  Mild maxTender Right frontal and maxillary sinuses: mild max Tender  Neck: No ant or post LAD CV: RRR, No M/G/R Pulm: Breathing comfortably in no resp distress. no w/c/r Abd: S,NT,ND,+BS Extr: no c/c/e Psych: full  affect, pleasant   Assessment and Plan: URI with facial pain, probable uri. Patient's wife with cancer and undergoing chemo -- i think it is reasonable to cover him with ABX given the situation

## 2011-04-10 NOTE — Telephone Encounter (Signed)
Patient coming in at 2:45 pm.

## 2011-04-10 NOTE — Telephone Encounter (Signed)
i would be happy to evaluate him today - we need to eval face to face to assess

## 2011-04-13 ENCOUNTER — Other Ambulatory Visit: Payer: Self-pay | Admitting: Internal Medicine

## 2011-04-13 MED ORDER — FLUTICASONE PROPIONATE 50 MCG/ACT NA SUSP: 2.0000 | Freq: Every day | NASAL | Status: AC | PRN

## 2011-04-13 NOTE — Telephone Encounter (Signed)
Requesting refill on Flonase. Sent to pharmacy.

## 2011-05-10 ENCOUNTER — Other Ambulatory Visit: Payer: Self-pay | Admitting: Internal Medicine

## 2011-05-10 MED ORDER — ALBUTEROL 90 MCG/ACT IN AERS: 1.0000 | INHALATION_SPRAY | RESPIRATORY_TRACT | Status: DC | PRN

## 2011-05-10 NOTE — Telephone Encounter (Signed)
Refill sent to pharmacy on Albuterol.

## 2011-09-25 ENCOUNTER — Other Ambulatory Visit: Payer: Self-pay | Admitting: Family Medicine

## 2011-10-25 ENCOUNTER — Ambulatory Visit
Admission: RE | Admit: 2011-10-25 | Discharge: 2011-10-25 | Disposition: A | Discharge status: Home / Self Care | Payer: 59 | Source: Ambulatory Visit | Attending: Sports Medicine | Admitting: Sports Medicine

## 2011-10-25 ENCOUNTER — Ambulatory Visit (INDEPENDENT_AMBULATORY_CARE_PROVIDER_SITE_OTHER): Payer: 59 | Admitting: Sports Medicine

## 2011-10-25 VITALS — BP 130/80 | Ht 71.0 in | Wt 250.0 lb

## 2011-10-25 DIAGNOSIS — M25571 Pain in right ankle and joints of right foot: Secondary | ICD-10-CM | POA: Insufficient documentation

## 2011-10-25 DIAGNOSIS — M25579 Pain in unspecified ankle and joints of unspecified foot: Secondary | ICD-10-CM

## 2011-10-25 NOTE — Assessment & Plan Note (Signed)
I suggested that he go ahead and get x-rays to see if there is more change in the bowel than what we're able to see with ultrasound  Begin a home exercise program to emphasize 1 foot balance  Use compression sleeves to try limit swelling in the ankle and gives more stability  He has a cavus foot and a very supinated gait so I put him in sports insoles with a lateral wedge to help lessen some of the supination  I want him to track this for the next 6-8 weeks to see if he improves with this strategy. He will then followup with Dr. Dallas Schimke to see if he is doing all right. This ankle is at higher risk of long-term arthritis so I think he needs to continue using support to lessen his risk of recurrent sprains.

## 2011-10-25 NOTE — Progress Notes (Signed)
  Subjective:    Patient ID: James Watts, male    DOB: August 25, 1955, 56 y.o.   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 Range of motion is full in all directions. Strength is 5/5 in all directions. Stable lateral and medial ligaments; squeeze test and kleiger test unremarkable; Talar dome nontender; No pain at base of 5th MT; No tenderness over cuboid; No tenderness over N spot or navicular prominence No tenderness on posterior aspects of lateral and medial malleolus No sign of peroneal tendon subluxations; Negative tarsal tunnel tinel's Able to walk 4 steps.   MSK [place] There is a tear of the distal syndesmsis with a bony avulsion with mild hypoechoic change around the remote injury Lateral malleolus also shows old avulsion fracture Talus shows chip in lateral corner and irregularity in medial corner Peroneal tendons show partial invagination of PL into PB        Assessment & Plan:

## 2011-12-05 ENCOUNTER — Other Ambulatory Visit: Payer: Self-pay | Admitting: Family Medicine

## 2011-12-05 ENCOUNTER — Telehealth: Payer: Self-pay

## 2011-12-05 DIAGNOSIS — K635 Polyp of colon: Secondary | ICD-10-CM

## 2011-12-05 DIAGNOSIS — Z1211 Encounter for screening for malignant neoplasm of colon: Secondary | ICD-10-CM

## 2011-12-05 NOTE — Telephone Encounter (Signed)
Pt request colonoscopy referral to Dr Dorita Fray at [facility] [address] [place], [place]. Pt not having problem but 2009 recommended repeat colonscopy every 3-5 years due to polyps.Please advise.

## 2011-12-05 NOTE — Telephone Encounter (Signed)
done

## 2011-12-22 ENCOUNTER — Other Ambulatory Visit: Payer: Self-pay | Admitting: *Deleted

## 2011-12-22 MED ORDER — ATORVASTATIN CALCIUM 20 MG PO TABS: 20.0000 mg | ORAL_TABLET | Freq: Every day | ORAL | Status: DC

## 2011-12-28 ENCOUNTER — Other Ambulatory Visit: Payer: Self-pay

## 2011-12-28 MED ORDER — OMEPRAZOLE 20 MG PO CPDR: 40.0000 mg | DELAYED_RELEASE_CAPSULE | Freq: Every day | ORAL | Status: DC

## 2011-12-28 MED ORDER — LISINOPRIL 10 MG PO TABS: 10.0000 mg | ORAL_TABLET | Freq: Every day | ORAL | Status: DC

## 2011-12-28 NOTE — Telephone Encounter (Signed)
Pt has changed mailorder pharmacy to [facility]; pt request 3 month refill  Omeprazole and Lisinopril to [facility]. Pt notified while here refills done; pt will call to schedule appt before need more refills.

## 2012-01-15 LAB — HM COLONOSCOPY: HM Colonoscopy: NORMAL

## 2012-01-22 ENCOUNTER — Telehealth: Payer: Self-pay | Admitting: Family Medicine

## 2012-01-22 NOTE — Telephone Encounter (Signed)
Patient notified as instructed by telephone. 

## 2012-01-22 NOTE — Telephone Encounter (Signed)
Please let him know - I am not sure this was communicated to patient.

## 2012-01-22 NOTE — Telephone Encounter (Signed)
Caller: Rick/Patient; Patient Name: James Watts; PCP: Hannah Beat [facility]); [facility] Phone Number: [phone]. States that his company has changed providers for various medications that he is on. Dropped off a form at office for his prescriptions to be filled at the new company. New company is [facility] [phone].  Has received some of the medications but not all of them. Has not received his Atorvistatin and is almost out of this medication. EPIC verifies that Lipitor was sent to [facility] as requested. Called [facility] and spoke with pharmacist there. states that Lipitor was mailed out but they can not find a tracking number, Pharmacist will get it sent out again for paitent with a RUSH on the order.

## 2012-02-13 ENCOUNTER — Ambulatory Visit (INDEPENDENT_AMBULATORY_CARE_PROVIDER_SITE_OTHER): Payer: 59

## 2012-02-13 DIAGNOSIS — Z23 Encounter for immunization: Secondary | ICD-10-CM

## 2012-03-13 ENCOUNTER — Encounter: Payer: Self-pay | Admitting: Family Medicine

## 2012-03-25 ENCOUNTER — Other Ambulatory Visit (INDEPENDENT_AMBULATORY_CARE_PROVIDER_SITE_OTHER): Payer: 59

## 2012-03-25 DIAGNOSIS — Z79899 Other long term (current) drug therapy: Secondary | ICD-10-CM

## 2012-03-25 DIAGNOSIS — E785 Hyperlipidemia, unspecified: Secondary | ICD-10-CM

## 2012-03-25 DIAGNOSIS — Z125 Encounter for screening for malignant neoplasm of prostate: Secondary | ICD-10-CM

## 2012-03-25 LAB — PSA: PSA: 0.91 ng/mL (ref 0.10–4.00)

## 2012-03-25 LAB — CBC WITH DIFFERENTIAL/PLATELET
Basophils Absolute: 0 10*3/uL (ref 0.0–0.1)
Eosinophils Absolute: 0.3 10*3/uL (ref 0.0–0.7)
Lymphocytes Relative: 26.7 % (ref 12.0–46.0)
MCHC: 33.8 g/dL (ref 30.0–36.0)
MCV: 90.6 fl (ref 78.0–100.0)
Monocytes Absolute: 0.7 10*3/uL (ref 0.1–1.0)
Neutrophils Relative %: 60.8 % (ref 43.0–77.0)
Platelets: 226 10*3/uL (ref 150.0–400.0)

## 2012-03-25 LAB — LIPID PANEL
Cholesterol: 168 mg/dL (ref 0–200)
HDL: 37.7 mg/dL — ABNORMAL LOW (ref >39.00)
LDL Cholesterol: 104 mg/dL — ABNORMAL HIGH (ref 0–99)
VLDL: 26.4 mg/dL (ref 0.0–40.0)

## 2012-03-25 LAB — BASIC METABOLIC PANEL
BUN: 16 mg/dL (ref 6–23)
CO2: 31 mEq/L (ref 19–32)
Chloride: 102 mEq/L (ref 96–112)
Glucose, Bld: 110 mg/dL — ABNORMAL HIGH (ref 70–99)
Potassium: 4.6 mEq/L (ref 3.5–5.1)

## 2012-03-25 LAB — HEPATIC FUNCTION PANEL
Alkaline Phosphatase: 71 U/L (ref 39–117)
Bilirubin, Direct: 0.1 mg/dL (ref 0.0–0.3)
Total Bilirubin: 0.8 mg/dL (ref 0.3–1.2)
Total Protein: 7 g/dL (ref 6.0–8.3)

## 2012-04-01 ENCOUNTER — Encounter: Payer: Self-pay | Admitting: Family Medicine

## 2012-04-01 ENCOUNTER — Ambulatory Visit (INDEPENDENT_AMBULATORY_CARE_PROVIDER_SITE_OTHER): Payer: 59 | Admitting: Family Medicine

## 2012-04-01 VITALS — BP 120/84 | HR 81 | Temp 98.2°F | Ht 71.0 in | Wt 250.8 lb

## 2012-04-01 DIAGNOSIS — Z0000 Encounter for general adult medical examination without abnormal findings: Secondary | ICD-10-CM

## 2012-04-01 MED ORDER — ATORVASTATIN CALCIUM 20 MG PO TABS: 20.0000 mg | ORAL_TABLET | Freq: Every day | ORAL | Status: DC

## 2012-04-01 MED ORDER — LISINOPRIL 10 MG PO TABS: 10.0000 mg | ORAL_TABLET | Freq: Every day | ORAL | Status: DC

## 2012-04-01 MED ORDER — SILDENAFIL CITRATE 100 MG PO TABS: 100.0000 mg | ORAL_TABLET | Freq: Every day | ORAL | Status: DC | PRN

## 2012-04-01 NOTE — Progress Notes (Signed)
Nature conservation officer at [facility] [address] [place] [address] Phone: [phone] Fax: [phone]  Date:  04/01/2012   Name:  James Watts   DOB:  1956-03-04   MRN:  [id number] Gender: male Age: 56 y.o.  PCP:  Hannah Beat, MD  Evaluating MD: Hannah Beat, MD   Chief Complaint: Annual Exam   History of Present Illness:  James Watts is a 56 y.o. pleasant patient who presents with the following:  Full CPX:  Preventative Health Maintenance Visit:  Health Maintenance Summary Reviewed and updated, unless pt declines services.  Tobacco History Reviewed. Alcohol: No concerns, no excessive use - once a week Exercise Habits: gym some, weights and walking STD concerns: no risk or activity to increase risk Drug Use: None Encouraged self-testicular check  Health Maintenance  Topic Date Due  . Influenza Vaccine  01/06/2013  . Colonoscopy  01/15/2015  . Tetanus/tdap  03/23/2019    Labs reviewed with the patient.  Results for orders placed in visit on 03/25/12  LIPID PANEL      Component Value Range   Cholesterol 168  0 - 200 mg/dL   Triglycerides 865.7  0.0 - 149.0 mg/dL   HDL 84.69 (*) >62.95 mg/dL   VLDL 28.4  0.0 - 13.2 mg/dL   LDL Cholesterol 440 (*) 0 - 99 mg/dL   Total CHOL/HDL Ratio 4    CBC WITH DIFFERENTIAL      Component Value Range   WBC 8.5  4.5 - 10.5 K/uL   RBC 4.99  4.22 - 5.81 Mil/uL   Hemoglobin 15.3  13.0 - 17.0 g/dL   HCT 10.2  72.5 - 36.6 %   MCV 90.6  78.0 - 100.0 fl   MCHC 33.8  30.0 - 36.0 g/dL   RDW 44.0  34.7 - 42.5 %   Platelets 226.0  150.0 - 400.0 K/uL   Neutrophils Relative 60.8  43.0 - 77.0 %   Lymphocytes Relative 26.7  12.0 - 46.0 %   Monocytes Relative 8.7  3.0 - 12.0 %   Eosinophils Relative 3.4  0.0 - 5.0 %   Basophils Relative 0.4  0.0 - 3.0 %   Neutro Abs 5.2  1.4 - 7.7 K/uL   Lymphs Abs 2.3  0.7 - 4.0 K/uL   Monocytes Absolute 0.7  0.1 - 1.0 K/uL   Eosinophils Absolute 0.3  0.0 - 0.7 K/uL   Basophils Absolute 0.0  0.0 - 0.1 K/uL  HEPATIC FUNCTION PANEL      Component Value Range   Total Bilirubin 0.8  0.3 - 1.2 mg/dL   Bilirubin, Direct 0.1  0.0 - 0.3 mg/dL   Alkaline Phosphatase 71  39 - 117 U/L   AST 29  0 - 37 U/L   ALT 30  0 - 53 U/L   Total Protein 7.0  6.0 - 8.3 g/dL   Albumin 4.2  3.5 - 5.2 g/dL  BASIC METABOLIC PANEL      Component Value Range   Sodium 138  135 - 145 mEq/L   Potassium 4.6  3.5 - 5.1 mEq/L   Chloride 102  96 - 112 mEq/L   CO2 31  19 - 32 mEq/L   Glucose, Bld 110 (*) 70 - 99 mg/dL   BUN 16  6 - 23 mg/dL   Creatinine, Ser 1.3  0.4 - 1.5 mg/dL   Calcium 9.2  8.4 - 95.6 mg/dL   GFR 38.75  >64.33 mL/min  PSA  Component Value Range   PSA 0.91  0.10 - 4.00 ng/mL     Patient Active Problem List  Diagnosis  . HYPERLIPIDEMIA  . HYPERTENSION  . GERD  . Allergic rhinitis due to pollen  . Ankle pain, right    No past medical history on file.  No past surgical history on file.  History  Substance Use Topics  . Smoking status: Never Smoker   . Smokeless tobacco: Not on file  . Alcohol Use: Yes    No family history on file.  Allergies  Allergen Reactions  . Cefaclor     REACTION: unspecified    Medication list has been reviewed and updated.  Outpatient Prescriptions Prior to Visit  Medication Sig Dispense Refill  . aspirin 81 MG tablet Take 81 mg by mouth daily.        Marland Kitchen atorvastatin (LIPITOR) 20 MG tablet Take 1 tablet (20 mg total) by mouth daily.  90 tablet  0  . fish oil-omega-3 fatty acids 1000 MG capsule Take 1 g by mouth daily.        Marland Kitchen lisinopril (PRINIVIL,ZESTRIL) 10 MG tablet Take 1 tablet (10 mg total) by mouth daily.  90 tablet  0  . omeprazole (PRILOSEC) 20 MG capsule Take 2 capsules (40 mg total) by mouth daily.  180 capsule  0  . sildenafil (VIAGRA) 50 MG tablet Take 1 tablet (50 mg total) by mouth daily as needed.  30 tablet  3  . albuterol (PROVENTIL,VENTOLIN) 90 MCG/ACT inhaler Inhale 1-2 puffs into the lungs  as needed. Do not use more than 4 x /day  17 g  2  . fluticasone (FLONASE) 50 MCG/ACT nasal spray Place 2 sprays into the nose daily as needed.  16 g  2   Last reviewed on 04/01/2012  8:24 AM by Consuello Masse, CMA  Review of Systems:   General: Denies fever, chills, sweats. No significant weight loss. Eyes: Denies blurring,significant itching ENT: Denies earache, sore throat, and hoarseness. Cardiovascular: Denies chest pains, palpitations, dyspnea on exertion Respiratory: Denies cough, dyspnea at rest,wheeezing Breast: no concerns about lumps GI: Denies nausea, vomiting, diarrhea, constipation, change in bowel habits, abdominal pain, melena, hematochezia GU: Denies penile discharge, ED, urinary flow / outflow problems. No STD concerns. Musculoskeletal: Denies back pain, some intermittent R ankle pain Derm: Denies rash, itching Neuro: Denies  paresthesias, frequent falls, frequent headaches Psych: Denies depression, anxiety Endocrine: Denies cold intolerance, heat intolerance, polydipsia Heme: Denies enlarged lymph nodes Allergy: No hayfever   Physical Examination: Filed Vitals:   04/01/12 0823  BP: 120/84  Pulse: 81  Temp: 98.2 F (36.8 C)  TempSrc: Oral  Height: 5\' 11"  (1.803 m)  Weight: 250 lb 12 oz (113.739 kg)  SpO2: 97%    Body mass index is 34.97 kg/(m^2). Ideal Body Weight: Weight in (lb) to have BMI = 25: 178.9    Wt Readings from Last 3 Encounters:  04/01/12 250 lb 12 oz (113.739 kg)  10/25/11 250 lb (113.399 kg)  04/10/11 252 lb 1.9 oz (114.361 kg)    GEN: well developed, well nourished, no acute distress Eyes: conjunctiva and lids normal, PERRLA, EOMI ENT: TM clear, nares clear, oral exam WNL Neck: supple, no lymphadenopathy, no thyromegaly, no JVD Pulm: clear to auscultation and percussion, respiratory effort normal CV: regular rate and rhythm, S1-S2, no murmur, rub or gallop, no bruits, peripheral pulses normal and symmetric, no cyanosis,  clubbing, edema or varicosities Chest: no scars, masses GI: soft, non-tender; no hepatosplenomegaly,  masses; active bowel sounds all quadrants. HEMORRHOIDS NOTED GU: no hernia, testicular mass, penile discharge, or prostate enlargement Lymph: no cervical, axillary or inguinal adenopathy MSK: gait normal, muscle tone and strength WNL, no joint swelling, effusions, discoloration, crepitus  SKIN: clear, good turgor, color WNL, no rashes, lesions, or ulcerations Neuro: normal mental status, normal strength, sensation, and motion Psych: alert; oriented to person, place and time, normally interactive and not anxious or depressed in appearance.  Assessment and Plan:  1. Routine general medical examination at a health care facility     The patient's preventative maintenance and recommended screening tests for an annual wellness exam were reviewed in full today. Brought up to date unless services declined.  Counselled on the importance of diet, exercise, and its role in overall health and mortality. The patient's FH and SH was reviewed, including their home life, tobacco status, and drug and alcohol status.   Work on [employer]  Orders Today:  No orders of the defined types were placed in this encounter.    Updated Medication List: (Includes new medications, updates to list, dose adjustments) Meds ordered this encounter  Medications  . sildenafil (VIAGRA) 100 MG tablet    Sig: Take 1 tablet (100 mg total) by mouth daily as needed.    Dispense:  30 tablet    Refill:  3  . lisinopril (PRINIVIL,ZESTRIL) 10 MG tablet    Sig: Take 1 tablet (10 mg total) by mouth daily.    Dispense:  90 tablet    Refill:  3  . atorvastatin (LIPITOR) 20 MG tablet    Sig: Take 1 tablet (20 mg total) by mouth daily.    Dispense:  90 tablet    Refill:  3    Medications Discontinued: Medications Discontinued During This Encounter  Medication Reason  . sildenafil (VIAGRA) 50 MG tablet Reorder  . lisinopril  (PRINIVIL,ZESTRIL) 10 MG tablet Reorder  . atorvastatin (LIPITOR) 20 MG tablet Reorder     Hannah Beat, MD

## 2012-04-19 ENCOUNTER — Other Ambulatory Visit: Payer: Self-pay | Admitting: *Deleted

## 2012-04-19 MED ORDER — OMEPRAZOLE 20 MG PO CPDR: 40.0000 mg | DELAYED_RELEASE_CAPSULE | Freq: Every day | ORAL | Status: DC

## 2012-07-11 ENCOUNTER — Ambulatory Visit (INDEPENDENT_AMBULATORY_CARE_PROVIDER_SITE_OTHER): Payer: 59 | Admitting: *Deleted

## 2012-07-11 DIAGNOSIS — Z23 Encounter for immunization: Secondary | ICD-10-CM

## 2012-08-13 ENCOUNTER — Telehealth: Payer: Self-pay

## 2012-08-13 MED ORDER — ALBUTEROL SULFATE HFA 108 (90 BASE) MCG/ACT IN AERS: 2.0000 | INHALATION_SPRAY | Freq: Four times a day (QID) | RESPIRATORY_TRACT | Status: DC | PRN

## 2012-08-13 NOTE — Telephone Encounter (Signed)
Please refill  Albuterol hfa, 2 puffs q 4 hours prn wheezing, #1, 3 refills

## 2012-08-13 NOTE — Telephone Encounter (Signed)
Done

## 2012-08-13 NOTE — Telephone Encounter (Signed)
Pt request refill albuterol inhaler to [facility]; unable to refill; notice came on screen needs new order.Please advise.

## 2012-08-19 ENCOUNTER — Telehealth: Payer: Self-pay

## 2012-08-19 NOTE — Telephone Encounter (Signed)
Pt called to ck status of albuterol inhaler at [facility]. [facility] said rx ready for pick up and pt notified via v/m on home #.

## 2013-02-01 ENCOUNTER — Other Ambulatory Visit: Payer: Self-pay | Admitting: Family Medicine

## 2013-02-10 ENCOUNTER — Other Ambulatory Visit: Payer: Self-pay | Admitting: Family Medicine

## 2013-02-27 ENCOUNTER — Ambulatory Visit (INDEPENDENT_AMBULATORY_CARE_PROVIDER_SITE_OTHER): Payer: 59

## 2013-02-27 DIAGNOSIS — Z23 Encounter for immunization: Secondary | ICD-10-CM

## 2013-03-23 ENCOUNTER — Other Ambulatory Visit: Payer: Self-pay | Admitting: Family Medicine

## 2013-03-24 ENCOUNTER — Other Ambulatory Visit: Payer: Self-pay | Admitting: Family Medicine

## 2013-03-24 DIAGNOSIS — E785 Hyperlipidemia, unspecified: Secondary | ICD-10-CM

## 2013-03-24 DIAGNOSIS — Z79899 Other long term (current) drug therapy: Secondary | ICD-10-CM

## 2013-03-24 DIAGNOSIS — Z125 Encounter for screening for malignant neoplasm of prostate: Secondary | ICD-10-CM

## 2013-03-27 ENCOUNTER — Other Ambulatory Visit (INDEPENDENT_AMBULATORY_CARE_PROVIDER_SITE_OTHER): Payer: 59

## 2013-03-27 DIAGNOSIS — Z79899 Other long term (current) drug therapy: Secondary | ICD-10-CM

## 2013-03-27 DIAGNOSIS — Z125 Encounter for screening for malignant neoplasm of prostate: Secondary | ICD-10-CM

## 2013-03-27 DIAGNOSIS — E785 Hyperlipidemia, unspecified: Secondary | ICD-10-CM

## 2013-03-27 LAB — CBC WITH DIFFERENTIAL/PLATELET
Basophils Relative: 0.5 % (ref 0.0–3.0)
Eosinophils Absolute: 0.2 10*3/uL (ref 0.0–0.7)
Eosinophils Relative: 2.9 % (ref 0.0–5.0)
HCT: 44.4 % (ref 39.0–52.0)
Hemoglobin: 15.3 g/dL (ref 13.0–17.0)
Lymphs Abs: 2.5 10*3/uL (ref 0.7–4.0)
MCHC: 34.5 g/dL (ref 30.0–36.0)
MCV: 89.1 fl (ref 78.0–100.0)
Monocytes Absolute: 0.8 10*3/uL (ref 0.1–1.0)
RBC: 4.98 Mil/uL (ref 4.22–5.81)
WBC: 8.5 10*3/uL (ref 4.5–10.5)

## 2013-03-27 LAB — PSA: PSA: 1.29 ng/mL (ref 0.10–4.00)

## 2013-03-27 LAB — HEPATIC FUNCTION PANEL
ALT: 34 U/L (ref 0–53)
Albumin: 4 g/dL (ref 3.5–5.2)
Total Protein: 6.8 g/dL (ref 6.0–8.3)

## 2013-03-27 LAB — LIPID PANEL
Cholesterol: 147 mg/dL (ref 0–200)
HDL: 36.6 mg/dL — ABNORMAL LOW (ref >39.00)

## 2013-03-27 LAB — BASIC METABOLIC PANEL
CO2: 30 mEq/L (ref 19–32)
GFR: 73.4 mL/min (ref >60.00)
Potassium: 4.2 mEq/L (ref 3.5–5.1)
Sodium: 137 mEq/L (ref 135–145)

## 2013-04-09 ENCOUNTER — Encounter: Payer: Self-pay | Admitting: Family Medicine

## 2013-04-09 ENCOUNTER — Ambulatory Visit (INDEPENDENT_AMBULATORY_CARE_PROVIDER_SITE_OTHER): Payer: 59 | Admitting: Family Medicine

## 2013-04-09 VITALS — BP 126/82 | HR 74 | Temp 98.3°F | Ht 73.0 in | Wt 248.2 lb

## 2013-04-09 DIAGNOSIS — I10 Essential (primary) hypertension: Secondary | ICD-10-CM

## 2013-04-09 DIAGNOSIS — E782 Mixed hyperlipidemia: Secondary | ICD-10-CM

## 2013-04-09 DIAGNOSIS — Z7721 Contact with and (suspected) exposure to potentially hazardous body fluids: Secondary | ICD-10-CM

## 2013-04-09 DIAGNOSIS — Z0000 Encounter for general adult medical examination without abnormal findings: Secondary | ICD-10-CM

## 2013-04-09 NOTE — Progress Notes (Signed)
Pre-visit discussion using our clinic review tool. No additional management support is needed unless otherwise documented below in the visit note.  

## 2013-04-09 NOTE — Progress Notes (Signed)
Date:  04/09/2013   Name:  James Watts   DOB:  28-Sep-1955   MRN:  [id number] Gender: male Age: 57 y.o.  Primary Physician:  Hannah Beat, MD   Chief Complaint: Annual Exam   Subjective:   History of Present Illness:  James Watts is a 57 y.o. pleasant patient who presents with the following:  Preventative Health Maintenance Visit:  Health Maintenance Summary Reviewed and updated, unless pt declines services.  Tobacco History Reviewed. Alcohol: No concerns, no excessive use Exercise Habits: Some activity, rec at least 30 mins 5 times a week - intermittent. Drug Use: None Encouraged self-testicular check  Health Maintenance  Topic Date Due  . Influenza Vaccine  12/06/2013  . Colonoscopy  01/15/2015  . Tetanus/tdap  07/12/2022    Patient Active Problem List   Diagnosis Date Noted  . Ankle pain, right 10/25/2011  . Allergic rhinitis due to pollen 03/22/2011  . HYPERLIPIDEMIA 02/25/2007  . HYPERTENSION 02/25/2007  . GERD 02/25/2007    No past medical history on file.  No past surgical history on file.  History   Social History  . Marital Status: Married    Spouse Name: N/A    Number of Children: N/A  . Years of Education: N/A   Occupational History  . Not on file.   Social History Main Topics  . Smoking status: Never Smoker   . Smokeless tobacco: Not on file  . Alcohol Use: Yes  . Drug Use: Not on file  . Sexual Activity: Not on file   Other Topics Concern  . Not on file   Social History Narrative  . No narrative on file    No family history on file.  Allergies  Allergen Reactions  . Cefaclor     REACTION: unspecified    Medication list has been reviewed and updated.  Review of Systems:  General: Denies fever, chills, sweats. No significant weight loss. Eyes: Denies blurring,significant itching ENT: Denies earache, sore throat, and hoarseness. Cardiovascular: Denies chest pains, palpitations, dyspnea on  exertion Respiratory: Denies cough, dyspnea at rest,wheeezing Breast: no concerns about lumps GI: Denies nausea, vomiting, diarrhea, constipation, change in bowel habits, abdominal pain, melena, hematochezia GU: Denies penile discharge, ED, urinary flow / outflow problems. No STD concerns. Musculoskeletal: Denies back pain, joint pain Derm: Denies rash, itching Neuro: Denies  paresthesias, frequent falls, frequent headaches Psych: Denies depression, anxiety Endocrine: Denies cold intolerance, heat intolerance, polydipsia Heme: Denies enlarged lymph nodes Allergy: No hayfever  Objective:   Physical Examination: BP 126/82  Pulse 74  Temp(Src) 98.3 F (36.8 C) (Oral)  Ht 6\' 1"  (1.854 m)  Wt 248 lb 4 oz (112.605 kg)  BMI 32.76 kg/m2  SpO2 99% Ideal Body Weight: Weight in (lb) to have BMI = 25: 189.1   Wt Readings from Last 3 Encounters:  04/09/13 248 lb 4 oz (112.605 kg)  04/01/12 250 lb 12 oz (113.739 kg)  10/25/11 250 lb (113.399 kg)    GEN: well developed, well nourished, no acute distress Eyes: conjunctiva and lids normal, PERRLA, EOMI ENT: TM clear, nares clear, oral exam WNL Neck: supple, no lymphadenopathy, no thyromegaly, no JVD Pulm: clear to auscultation and percussion, respiratory effort normal CV: regular rate and rhythm, S1-S2, no murmur, rub or gallop, no bruits, peripheral pulses normal and symmetric, no cyanosis, clubbing, edema or varicosities Chest: no scars, masses GI: soft, non-tender; no hepatosplenomegaly, masses; active bowel sounds all quadrants GU: no hernia, testicular mass, penile discharge, or prostate enlargement Lymph: no  cervical, axillary or inguinal adenopathy MSK: gait normal, muscle tone and strength WNL, no joint swelling, effusions, discoloration, crepitus  SKIN: clear, good turgor, color WNL, no rashes, lesions, or ulcerations Neuro: normal mental status, normal strength, sensation, and motion Psych: alert; oriented to person, place and  time, normally interactive and not anxious or depressed in appearance.  All labs reviewed with patient.  Lipids:    Component Value Date/Time   CHOL 147 03/27/2013 0819   TRIG 124.0 03/27/2013 0819   HDL 36.60* 03/27/2013 0819   LDLDIRECT 112.5 03/22/2011 1451   VLDL 24.8 03/27/2013 0819   CHOLHDL 4 03/27/2013 0819    CBC:    Component Value Date/Time   WBC 8.5 03/27/2013 0819   HGB 15.3 03/27/2013 0819   HCT 44.4 03/27/2013 0819   PLT 221.0 03/27/2013 0819   MCV 89.1 03/27/2013 0819   NEUTROABS 4.9 03/27/2013 0819   LYMPHSABS 2.5 03/27/2013 0819   MONOABS 0.8 03/27/2013 0819   EOSABS 0.2 03/27/2013 0819   BASOSABS 0.0 03/27/2013 0819    Basic Metabolic Panel:    Component Value Date/Time   NA 137 03/27/2013 0819   K 4.2 03/27/2013 0819   CL 101 03/27/2013 0819   CO2 30 03/27/2013 0819   BUN 18 03/27/2013 0819   CREATININE 1.1 03/27/2013 0819   GLUCOSE 93 03/27/2013 0819   CALCIUM 9.1 03/27/2013 0819    Lab Results  Component Value Date   ALT 34 03/27/2013   AST 26 03/27/2013   ALKPHOS 64 03/27/2013   BILITOT 1.2 03/27/2013    No results found for this basename: TSH    Lab Results  Component Value Date   PSA 1.29 03/27/2013   PSA 0.91 03/25/2012   PSA 0.77 03/22/2011    Assessment & Plan:   Health Maintenance Exam: The patient's preventative maintenance and recommended screening tests for an annual wellness exam were reviewed in full today. Brought up to date unless services declined.  Counselled on the importance of diet, exercise, and its role in overall health and mortality. The patient's FH and SH was reviewed, including their home life, tobacco status, and drug and alcohol status.   Routine general medical examination at a health care facility  Hx of exposure to hazardous bodily fluids - Plan: Hepatitis panel, acute, HIV antibody, RPR, GC/chlamydia probe amp, urine  HYPERTENSION  HYPERLIPIDEMIA  >15 minutes spent in face to face time  with patient, >50% spent in counselling or coordination of care: from health maintenance standpoint the patient is doing fairly well, but we spent an extra 15-20 minutes in counseling discussing the potential bodily fluid exposure to the patient. He was upset about this, and I counseled him to the best of my ability. We're going to go and check him for the pathogens listed below.  Orders Today:  Orders Placed This Encounter  Procedures  . Hepatitis panel, acute  . HIV antibody  . RPR  . GC/chlamydia probe amp, urine    New medications, updates to list, dose adjustments: Meds ordered this encounter  Medications  . Turmeric 450 MG CAPS    Sig: Take 1 tablet by mouth daily.    Signed,  Elpidio Galea. Aleeah Greeno, MD, CAQ Sports Medicine  [facility] [address] [place] [address] Phone: [phone] Fax: [phone]  Updated Complete Medication List:   Medication List       This list is accurate as of: 04/09/13 11:59 PM.  Always use your most  recent med list.               albuterol 108 (90 BASE) MCG/ACT inhaler  Commonly known as:  PROVENTIL HFA;VENTOLIN HFA  Inhale 2 puffs into the lungs every 6 (six) hours as needed for wheezing.     aspirin 81 MG tablet  Take 81 mg by mouth daily.     atorvastatin 20 MG tablet  Commonly known as:  LIPITOR  Take 1 tablet by mouth  daily     fish oil-omega-3 fatty acids 1000 MG capsule  Take 1 g by mouth daily.     fluticasone 50 MCG/ACT nasal spray  Commonly known as:  FLONASE  Place 2 sprays into the nose daily as needed.     lisinopril 10 MG tablet  Commonly known as:  PRINIVIL,ZESTRIL  Take 1 tablet by mouth  daily     omeprazole 20 MG capsule  Commonly known as:  PRILOSEC  Take 2 capsules (40 mg total) by mouth daily.     sildenafil 100 MG tablet  Commonly known as:  VIAGRA  Take 1 tablet (100 mg total) by mouth daily as needed.     Turmeric 450 MG Caps  Take 1 tablet by mouth daily.

## 2013-04-10 ENCOUNTER — Encounter: Payer: Self-pay | Admitting: Family Medicine

## 2013-04-10 LAB — HEPATITIS PANEL, ACUTE
HCV Ab: NEGATIVE
Hep A IgM: NONREACTIVE
Hep B C IgM: NONREACTIVE
Hepatitis B Surface Ag: NEGATIVE

## 2013-04-10 LAB — RPR

## 2013-04-10 LAB — GC/CHLAMYDIA PROBE AMP, URINE
Chlamydia, Swab/Urine, PCR: NEGATIVE
GC Probe Amp, Urine: NEGATIVE

## 2013-04-11 ENCOUNTER — Telehealth: Payer: Self-pay

## 2013-04-11 NOTE — Telephone Encounter (Signed)
Pt wanted Dr Patsy Lager to know he got the v/m and pt does not need a report sent to him. Pt is going to ck with his ins co about shingles vaccine coverage and pt will cb about shingles shot.

## 2013-04-14 ENCOUNTER — Other Ambulatory Visit: Payer: Self-pay | Admitting: Family Medicine

## 2013-07-22 ENCOUNTER — Ambulatory Visit: Payer: 59 | Admitting: Family Medicine

## 2013-09-01 ENCOUNTER — Other Ambulatory Visit: Payer: Self-pay | Admitting: *Deleted

## 2013-09-01 MED ORDER — LISINOPRIL 10 MG PO TABS: ORAL_TABLET | ORAL | Status: DC

## 2013-09-01 MED ORDER — ATORVASTATIN CALCIUM 20 MG PO TABS: ORAL_TABLET | ORAL | Status: DC

## 2013-09-01 MED ORDER — OMEPRAZOLE 20 MG PO CPDR: 40.0000 mg | DELAYED_RELEASE_CAPSULE | Freq: Every day | ORAL | Status: DC

## 2014-03-06 ENCOUNTER — Ambulatory Visit (INDEPENDENT_AMBULATORY_CARE_PROVIDER_SITE_OTHER): Payer: BLUE CROSS/BLUE SHIELD

## 2014-03-06 DIAGNOSIS — Z23 Encounter for immunization: Secondary | ICD-10-CM

## 2014-05-08 HISTORY — PX: TOTAL ANKLE ARTHROPLASTY: SHX811

## 2014-05-22 ENCOUNTER — Telehealth: Payer: Self-pay | Admitting: *Deleted

## 2014-05-22 MED ORDER — OMEPRAZOLE 20 MG PO CPDR: 40.0000 mg | DELAYED_RELEASE_CAPSULE | Freq: Every day | ORAL | Status: DC

## 2014-05-22 MED ORDER — LISINOPRIL 10 MG PO TABS: ORAL_TABLET | ORAL | Status: DC

## 2014-05-22 MED ORDER — ATORVASTATIN CALCIUM 20 MG PO TABS: ORAL_TABLET | ORAL | Status: DC

## 2014-05-22 NOTE — Telephone Encounter (Signed)
Please call and schedule CPE with fasting labs prior for Dr. Patsy Lager sometime in the next three months.

## 2014-05-28 ENCOUNTER — Other Ambulatory Visit: Payer: Self-pay | Admitting: *Deleted

## 2014-05-28 MED ORDER — ATORVASTATIN CALCIUM 20 MG PO TABS: ORAL_TABLET | ORAL | Status: DC

## 2014-05-28 MED ORDER — OMEPRAZOLE 20 MG PO CPDR: 40.0000 mg | DELAYED_RELEASE_CAPSULE | Freq: Every day | ORAL | Status: DC

## 2014-05-28 MED ORDER — LISINOPRIL 10 MG PO TABS: ORAL_TABLET | ORAL | Status: DC

## 2014-05-28 NOTE — Telephone Encounter (Signed)
Pt called Triage. Pt recently had Rxs sent to [facility] pharmacy but since his insurance has changed he needs Rxs sent to local pharmacy, done and pt notified

## 2014-06-16 ENCOUNTER — Encounter: Payer: Self-pay | Admitting: Family Medicine

## 2014-06-16 ENCOUNTER — Ambulatory Visit (INDEPENDENT_AMBULATORY_CARE_PROVIDER_SITE_OTHER): Payer: 59 | Admitting: Family Medicine

## 2014-06-16 VITALS — BP 138/82 | HR 90 | Temp 98.1°F | Wt 247.8 lb

## 2014-06-16 DIAGNOSIS — J209 Acute bronchitis, unspecified: Secondary | ICD-10-CM

## 2014-06-16 MED ORDER — AZITHROMYCIN 250 MG PO TABS: ORAL_TABLET | ORAL | Status: DC

## 2014-06-16 NOTE — Progress Notes (Signed)
Pre visit review using our clinic review tool, if applicable. No additional management support is needed unless otherwise documented below in the visit note. 

## 2014-06-16 NOTE — Patient Instructions (Signed)
Complete antibiotics. Use albuterol as needed. Mucinex DM twice daily for cough. Call if not improving as expected.

## 2014-06-16 NOTE — Assessment & Plan Note (Signed)
Treat with antibiotics given > 7-10 days of symptoms and going to [place] in 4 days. Also wife is immunocompromised on chemo

## 2014-06-16 NOTE — Progress Notes (Signed)
   Subjective:    Patient ID: James Watts, male    DOB: 1955-12-16, 59 y.o.   MRN: [id number]  Cough This is a new problem. The current episode started in the past 7 days. The problem has been unchanged. The problem occurs constantly. The cough is non-productive. Associated symptoms include a fever, a sore throat and wheezing. Pertinent negatives include no chest pain, chills, ear congestion, ear pain, headaches, nasal congestion, rhinorrhea or shortness of breath. Associated symptoms comments: Last week. The symptoms are aggravated by lying down. Risk factors: nonsmoker. Treatments tried: flonase, using albuterol. His past medical history is significant for environmental allergies. There is no history of asthma, bronchiectasis, bronchitis, COPD, emphysema or pneumonia. reactive airways with allergies, no official asthma.      Review of Systems  Constitutional: Positive for fever. Negative for chills.  HENT: Positive for sore throat. Negative for ear pain and rhinorrhea.   Respiratory: Positive for cough and wheezing. Negative for shortness of breath.   Cardiovascular: Negative for chest pain.  Allergic/Immunologic: Positive for environmental allergies.  Neurological: Negative for headaches.       Objective:   Physical Exam  Constitutional: Vital signs are normal. He appears well-developed and well-nourished.  HENT:  Head: Normocephalic.  Right Ear: Hearing normal. No middle ear effusion.  Left Ear: Hearing normal.  No middle ear effusion.  Nose: Mucosal edema and rhinorrhea present. Right sinus exhibits no maxillary sinus tenderness and no frontal sinus tenderness. Left sinus exhibits no maxillary sinus tenderness and no frontal sinus tenderness.  Mouth/Throat: Mucous membranes are normal. Posterior oropharyngeal erythema present. No oropharyngeal exudate or posterior oropharyngeal edema.  Neck: Trachea normal. Carotid bruit is not present. No thyroid mass and no thyromegaly present.   Cardiovascular: Normal rate, regular rhythm and normal pulses.  Exam reveals no gallop, no distant heart sounds and no friction rub.   No murmur heard. No peripheral edema  Pulmonary/Chest: Effort normal and breath sounds normal. No respiratory distress. He has no decreased breath sounds. He has no wheezes. He has no rhonchi. He has no rales.  Frequent cough  no current wheeze  Skin: Skin is warm, dry and intact. No rash noted.  Psychiatric: He has a normal mood and affect. His speech is normal and behavior is normal. Thought content normal.          Assessment & Plan:

## 2014-08-12 ENCOUNTER — Ambulatory Visit (INDEPENDENT_AMBULATORY_CARE_PROVIDER_SITE_OTHER): Payer: 59 | Admitting: Family Medicine

## 2014-08-12 ENCOUNTER — Encounter: Payer: Self-pay | Admitting: Family Medicine

## 2014-08-12 VITALS — BP 142/80 | HR 87 | Temp 97.7°F | Ht 71.5 in | Wt 249.8 lb

## 2014-08-12 DIAGNOSIS — M12571 Traumatic arthropathy, right ankle and foot: Secondary | ICD-10-CM

## 2014-08-12 DIAGNOSIS — M19171 Post-traumatic osteoarthritis, right ankle and foot: Secondary | ICD-10-CM

## 2014-08-12 NOTE — Patient Instructions (Addendum)
Coronary Calcium Score (Coronary CT scan)   REFERRALS TO SPECIALISTS, SPECIAL TESTS (MRI, CT, ULTRASOUNDS)  MARION or LINDA will help you. ASK CHECK-IN FOR HELP.  Imaging / Special Testing referrals sometimes can be done same day if EMERGENCY, but others can take 2 or 3 days to get an appointment. Starting in 2015, many of the new Medicare plans and Obamacare plans take much longer.   Specialist appointment times vary a great deal, based on their schedule / openings. -- Some specialists have very long wait times. (Example. Dermatology. Multiple months  for non-cancer)

## 2014-08-12 NOTE — Progress Notes (Signed)
Pre visit review using our clinic review tool, if applicable. No additional management support is needed unless otherwise documented below in the visit note. 

## 2014-08-12 NOTE — Progress Notes (Signed)
Dr. Karleen Hampshire T. Henna Derderian, MD, CAQ Sports Medicine [facility] and Sports [facility] [address] [place], [address] Phone: [phone] Fax: [phone]  08/12/2014  Patient: James Watts, MRN: [id number], DOB: [birth year], 59 y.o.  Primary Physician:  Hannah Beat, MD  Chief Complaint: Ankle Pain  Subjective:   James Watts is a 59 y.o. very pleasant male patient who presents with the following:  Pleasant gentleman is here primarily about his right ankle.  He actually saw foot and ankle surgery from [facility] within the last year.  They reviewed different options with him including an ankle fusion and they felt this would be more likely helpful to him rather than an ankle replacement.  His at multiple ankle injuries over the years.  Dr. Irving Burton gave him an intra-articular ankle injection which only brought him some relief for about 1-2 months.  He is limping at baseline and not able to play golf or do any significant walking or hiking anymore.  Past Medical History, Surgical History, Social History, Family History, Problem List, Medications, and Allergies have been reviewed and updated if relevant.  GEN: No fevers, chills. Nontoxic. Primarily MSK c/o today. MSK: Detailed in the HPI GI: tolerating PO intake without difficulty Neuro: No numbness, parasthesias, or tingling associated. Otherwise the pertinent positives of the ROS are noted above.   Objective:   BP 142/80 mmHg  Pulse 87  Temp(Src) 97.7 F (36.5 C) (Oral)  Ht 5' 11.5" (1.816 m)  Wt 249 lb 12.8 oz (113.309 kg)  BMI 34.36 kg/m2  SpO2 95%   GEN: WDWN, NAD, Non-toxic, Alert & Oriented x 3 HEENT: Atraumatic, Normocephalic.  Ears and Nose: No external deformity. EXTR: No clubbing/cyanosis/edema PSYCH: Normally interactive. Conversant. Not depressed or anxious appearing.  Calm demeanor.    On the right side he walks with his foot turned outward and and pronation.  There is approximately 30% loss of  flexion, dorsiflexion and plantar flexion in the right compared to the left.  There is pain at the true ankle joint.  There seems to be a mild effusion.  On the lateral malleolus he does appear to have some additional bone that is formed.  His supportive tendons are roughly nontender.  Pain was subtalar tilt testing also. His movement in the forefoot and at the toes is actually quite good.  Radiology: No results found.  Assessment and Plan:   Post-traumatic arthritis of ankle, right - Plan: Ambulatory referral to Orthopedic Surgery  This is a challenging case.  He is certainly limited and having pain on a daily basis.  He is taking multiple oral medications including NSAIDs and Tylenol, and he also has had some intra-articular injections without much success.  He is very frustrated.  Nevertheless, I think that the decision to have major ankle surgeries highly personal.  I reviewed that he could certainly follow the opinion from [facility], or if he wanted to have an Surveyor, minerals evaluate him for a second opinion, Dr. Dareen Piano in [place] is one of the foremost ankle surgeons worldwide.  At this point, we're going to consult [facility] foot and ankle surgery, and he will see the gentleman who has son-in-law has recommended.  Orders Placed This Encounter  Procedures  . Ambulatory referral to Orthopedic Surgery    Signed,  Karleen Hampshire T. Ashle Stief, MD   Patient's Medications  New Prescriptions   No medications on file  Previous Medications   ALBUTEROL (PROVENTIL HFA;VENTOLIN HFA) 108 (90 BASE) MCG/ACT INHALER    Inhale 2  puffs into the lungs every 6 (six) hours as needed for wheezing.   ASPIRIN 81 MG TABLET    Take 81 mg by mouth daily.     ATORVASTATIN (LIPITOR) 20 MG TABLET    Take 1 tablet by mouth  daily   CINNAMON 500 MG CAPSULE    Take 500 mg by mouth daily.   FINASTERIDE (PROSCAR) 5 MG TABLET    Take 5 mg by mouth daily.   FISH OIL-OMEGA-3 FATTY ACIDS 1000 MG CAPSULE    Take 1 g by mouth daily.      FLUTICASONE (FLONASE) 50 MCG/ACT NASAL SPRAY    Place 2 sprays into the nose daily as needed.   LISINOPRIL (PRINIVIL,ZESTRIL) 10 MG TABLET    Take 1 tablet by mouth  daily   MULTIPLE VITAMIN (MULTIVITAMIN) TABLET    Take 1 tablet by mouth daily.   OMEPRAZOLE (PRILOSEC) 20 MG CAPSULE    Take 2 capsules (40 mg total) by mouth daily.   SILDENAFIL (VIAGRA) 100 MG TABLET    Take 1 tablet (100 mg total) by mouth daily as needed.   TURMERIC 450 MG CAPS    Take 1 tablet by mouth daily.  Modified Medications   No medications on file  Discontinued Medications   AZITHROMYCIN (ZITHROMAX) 250 MG TABLET    2 tab po x 1 day then 1 tab po daily

## 2014-08-13 DIAGNOSIS — M19179 Post-traumatic osteoarthritis, unspecified ankle and foot: Secondary | ICD-10-CM | POA: Insufficient documentation

## 2014-08-19 ENCOUNTER — Other Ambulatory Visit (INDEPENDENT_AMBULATORY_CARE_PROVIDER_SITE_OTHER): Payer: 59

## 2014-08-19 DIAGNOSIS — Z125 Encounter for screening for malignant neoplasm of prostate: Secondary | ICD-10-CM

## 2014-08-19 DIAGNOSIS — E785 Hyperlipidemia, unspecified: Secondary | ICD-10-CM

## 2014-08-19 DIAGNOSIS — Z79899 Other long term (current) drug therapy: Secondary | ICD-10-CM | POA: Diagnosis not present

## 2014-08-19 LAB — LIPID PANEL
CHOL/HDL RATIO: 5
CHOLESTEROL: 192 mg/dL (ref 0–200)
HDL: 42.2 mg/dL (ref >39.00)
LDL CALC: 123 mg/dL — AB (ref 0–99)
NonHDL: 149.8
Triglycerides: 133 mg/dL (ref 0.0–149.0)
VLDL: 26.6 mg/dL (ref 0.0–40.0)

## 2014-08-19 LAB — BASIC METABOLIC PANEL
BUN: 17 mg/dL (ref 6–23)
CO2: 30 mEq/L (ref 19–32)
Calcium: 9.3 mg/dL (ref 8.4–10.5)
Chloride: 101 mEq/L (ref 96–112)
Creatinine, Ser: 1.11 mg/dL (ref 0.40–1.50)
GFR: 71.54 mL/min (ref >60.00)
Glucose, Bld: 100 mg/dL — ABNORMAL HIGH (ref 70–99)
POTASSIUM: 4.5 meq/L (ref 3.5–5.1)
SODIUM: 137 meq/L (ref 135–145)

## 2014-08-19 LAB — CBC WITH DIFFERENTIAL/PLATELET
BASOS PCT: 0.5 % (ref 0.0–3.0)
Basophils Absolute: 0 10*3/uL (ref 0.0–0.1)
EOS ABS: 0.3 10*3/uL (ref 0.0–0.7)
Eosinophils Relative: 3.1 % (ref 0.0–5.0)
HEMATOCRIT: 44.5 % (ref 39.0–52.0)
HEMOGLOBIN: 15.2 g/dL (ref 13.0–17.0)
LYMPHS PCT: 29 % (ref 12.0–46.0)
Lymphs Abs: 2.4 10*3/uL (ref 0.7–4.0)
MCHC: 34.1 g/dL (ref 30.0–36.0)
MCV: 89.1 fl (ref 78.0–100.0)
Monocytes Absolute: 0.8 10*3/uL (ref 0.1–1.0)
Monocytes Relative: 9.3 % (ref 3.0–12.0)
Neutro Abs: 4.8 10*3/uL (ref 1.4–7.7)
Neutrophils Relative %: 58.1 % (ref 43.0–77.0)
Platelets: 211 10*3/uL (ref 150.0–400.0)
RBC: 5 Mil/uL (ref 4.22–5.81)
RDW: 14.5 % (ref 11.5–15.5)
WBC: 8.3 10*3/uL (ref 4.0–10.5)

## 2014-08-19 LAB — HEPATIC FUNCTION PANEL
ALBUMIN: 4.1 g/dL (ref 3.5–5.2)
ALT: 24 U/L (ref 0–53)
AST: 22 U/L (ref 0–37)
Alkaline Phosphatase: 74 U/L (ref 39–117)
BILIRUBIN DIRECT: 0.1 mg/dL (ref 0.0–0.3)
TOTAL PROTEIN: 6.9 g/dL (ref 6.0–8.3)
Total Bilirubin: 0.7 mg/dL (ref 0.2–1.2)

## 2014-08-19 LAB — PSA: PSA: 0.72 ng/mL (ref 0.10–4.00)

## 2014-08-21 ENCOUNTER — Other Ambulatory Visit: Payer: 59

## 2014-08-24 ENCOUNTER — Encounter: Payer: Self-pay | Admitting: Family Medicine

## 2014-08-24 ENCOUNTER — Ambulatory Visit (INDEPENDENT_AMBULATORY_CARE_PROVIDER_SITE_OTHER): Payer: 59 | Admitting: Family Medicine

## 2014-08-24 VITALS — BP 124/64 | HR 81 | Temp 98.1°F | Ht 71.5 in | Wt 249.2 lb

## 2014-08-24 DIAGNOSIS — Z8249 Family history of ischemic heart disease and other diseases of the circulatory system: Secondary | ICD-10-CM

## 2014-08-24 DIAGNOSIS — Z0000 Encounter for general adult medical examination without abnormal findings: Secondary | ICD-10-CM

## 2014-08-24 NOTE — Progress Notes (Signed)
Pre visit review using our clinic review tool, if applicable. No additional management support is needed unless otherwise documented below in the visit note. 

## 2014-08-24 NOTE — Progress Notes (Signed)
Dr. Frederico Hamman T. Sunya Humbarger, MD, [facility] [place], [address] Phone: [phone] Fax: [phone]  08/24/2014  Patient: James Watts, MRN: [id number], DOB: 19-Jul-1955, 59 y.o.  Primary Physician:  Owens Loffler, MD  Chief Complaint: Annual Exam  Subjective:   James Watts is a 59 y.o. pleasant patient who presents with the following:  Preventative Health Maintenance Visit:  Health Maintenance Summary Reviewed and updated, unless pt declines services.  Tobacco History Reviewed. Alcohol: No concerns, no excessive use Exercise Habits: rare activity, rec at least 30 mins 5 times a week STD concerns: no risk or activity to increase risk Drug Use: None Encouraged self-testicular check  Health Maintenance  Topic Date Due  . ZOSTAVAX  06/22/2013  . INFLUENZA VACCINE  12/07/2014  . COLONOSCOPY  01/14/2017  . TETANUS/TDAP  07/12/2022  . HIV Screening  Completed   Immunization History  Administered Date(s) Administered  . Influenza Split 03/22/2011, 02/13/2012  . Influenza Whole 02/25/2007, 03/03/2008, 03/22/2009, 04/06/2010  . Influenza,inj,Quad PF,36+ Mos 02/27/2013, 03/06/2014  . Td 03/22/2009  . Tdap 07/11/2012   Patient Active Problem List   Diagnosis Date Noted  . Post-traumatic arthritis of ankle 08/13/2014  . Allergic rhinitis due to pollen 03/22/2011  . HYPERLIPIDEMIA 02/25/2007  . HYPERTENSION 02/25/2007  . GERD (gastroesophageal reflux disease) 02/25/2007   Past Medical History  Diagnosis Date  . HYPERLIPIDEMIA 02/25/2007    Qualifier: Diagnosis of  By: Erin Hearing MD, Ruthann Cancer    . HYPERTENSION 02/25/2007    Qualifier: Diagnosis of  By: Erin Hearing MD, Ruthann Cancer    . GERD (gastroesophageal reflux disease) 02/25/2007    Qualifier: Diagnosis of  By: Erin Hearing MD, Ruthann Cancer    . Allergic rhinitis due to pollen 03/22/2011   No past surgical history on file. History   Social History  . Marital  Status: Married    Spouse Name: N/A  . Number of Children: N/A  . Years of Education: N/A   Occupational History  . Not on file.   Social History Main Topics  . Smoking status: Never Smoker   . Smokeless tobacco: Never Used  . Alcohol Use: 0.0 oz/week    0 Standard drinks or equivalent per week  . Drug Use: No  . Sexual Activity: Not on file   Other Topics Concern  . Not on file   Social History Narrative   No family history on file. Allergies  Allergen Reactions  . Cefaclor     REACTION: unspecified    Medication list has been reviewed and updated.   General: Denies fever, chills, sweats. No significant weight loss. Eyes: Denies blurring,significant itching ENT: Denies earache, sore throat, and hoarseness. Cardiovascular: Denies chest pains, palpitations, dyspnea on exertion Respiratory: Denies cough, dyspnea at rest,wheeezing Breast: no concerns about lumps GI: Denies nausea, vomiting, diarrhea, constipation, change in bowel habits, abdominal pain, melena, hematochezia GU: Denies penile discharge, ED, urinary flow / outflow problems. No STD concerns. Musculoskeletal: STILL WITH ANKLE PAIN, CONSIDERING FUSION Derm: Denies rash, itching Neuro: Denies  paresthesias, frequent falls, frequent headaches Psych: Denies depression, anxiety Endocrine: Denies cold intolerance, heat intolerance, polydipsia Heme: Denies enlarged lymph nodes Allergy: No hayfever  Objective:   BP 124/64 mmHg  Pulse 81  Temp(Src) 98.1 F (36.7 C) (Oral)  Ht 5' 11.5" (1.816 m)  Wt 249 lb 4 oz (113.059 kg)  BMI 34.28 kg/m2 Ideal Body Weight: Weight in (lb) to have BMI = 25: 181.4  No exam data present  GEN: well developed, well nourished, no acute distress Eyes: conjunctiva and lids normal, PERRLA, EOMI ENT: TM clear, nares clear, oral exam WNL Neck: supple, no lymphadenopathy, no thyromegaly, no JVD Pulm: clear to auscultation and percussion, respiratory effort normal CV: regular  rate and rhythm, S1-S2, no murmur, rub or gallop, no bruits, peripheral pulses normal and symmetric, no cyanosis, clubbing, edema or varicosities GI: soft, non-tender; no hepatosplenomegaly, masses; active bowel sounds all quadrants GU: defer Lymph: no cervical, axillary or inguinal adenopathy MSK: gait normal, muscle tone and strength WNL SKIN: clear, good turgor, color WNL, no rashes, lesions, or ulcerations Neuro: normal mental status, normal strength, sensation, and motion Psych: alert; oriented to person, place and time, normally interactive and not anxious or depressed in appearance. All labs reviewed with patient.  Lipids:    Component Value Date/Time   CHOL 192 08/19/2014 1103   TRIG 133.0 08/19/2014 1103   HDL 42.20 08/19/2014 1103   LDLDIRECT 112.5 03/22/2011 1451   VLDL 26.6 08/19/2014 1103   CHOLHDL 5 08/19/2014 1103   CBC: CBC Latest Ref Rng 08/19/2014 03/27/2013 03/25/2012  WBC 4.0 - 10.5 K/uL 8.3 8.5 8.5  Hemoglobin 13.0 - 17.0 g/dL 15.2 15.3 15.3  Hematocrit 39.0 - 52.0 % 44.5 44.4 45.2  Platelets 150.0 - 400.0 K/uL 211.0 221.0 763.9    Basic Metabolic Panel:    Component Value Date/Time   NA 137 08/19/2014 1103   K 4.5 08/19/2014 1103   CL 101 08/19/2014 1103   CO2 30 08/19/2014 1103   BUN 17 08/19/2014 1103   CREATININE 1.11 08/19/2014 1103   GLUCOSE 100* 08/19/2014 1103   CALCIUM 9.3 08/19/2014 1103   Hepatic Function Latest Ref Rng 08/19/2014 03/27/2013 03/25/2012  Total Protein 6.0 - 8.3 g/dL 6.9 6.8 7.0  Albumin 3.5 - 5.2 g/dL 4.1 4.0 4.2  AST 0 - 37 U/L _0 ALT 0 - 53 U/L 24 34 30  Alk Phosphatase 39 - 117 U/L 74 64 71  Total Bilirubin 0.2 - 1.2 mg/dL 0.7 1.2 0.8  Bilirubin, Direct 0.0 - 0.3 mg/dL 0.1 0.2 0.1    No results found for: TSH Lab Results  Component Value Date   PSA 0.72 08/19/2014   PSA 1.29 03/27/2013   PSA 0.91 03/25/2012    Assessment and Plan:   Healthcare maintenance  Family history of early CAD - Plan: CT  Cardiac Scoring, CANCELED: CT HEART W/O CONTRAST MEDIA - QUANT EVAL CORONARY CALCIUM  Health Maintenance Exam: The patient's preventative maintenance and recommended screening tests for an annual wellness exam were reviewed in full today. Brought up to date unless services declined.  Counselled on the importance of diet, exercise, and its role in overall health and mortality. The patient's FH and SH was reviewed, including their home life, tobacco status, and drug and alcohol status.  Doing reasonably well - uptick on chol this year Work on weight  Follow-up: No Follow-up on file. Unless noted, follow-up in 1 year for Health Maintenance Exam.  New Prescriptions   No medications on file   Orders Placed This Encounter  Procedures  . CT Cardiac Scoring    Signed,  Frederico Hamman T. Janann Boeve, MD   Patient's Medications  New Prescriptions   No medications on file  Previous Medications   ALBUTEROL (PROVENTIL HFA;VENTOLIN HFA) 108 (90 BASE) MCG/ACT INHALER    Inhale 2 puffs into the lungs every 6 (six) hours as needed for wheezing.   ASPIRIN 81 MG TABLET  Take 81 mg by mouth daily.     ATORVASTATIN (LIPITOR) 20 MG TABLET    Take 1 tablet by mouth  daily   CINNAMON 500 MG CAPSULE    Take 500 mg by mouth daily.   FINASTERIDE (PROSCAR) 5 MG TABLET    Take 5 mg by mouth daily.   FISH OIL-OMEGA-3 FATTY ACIDS 1000 MG CAPSULE    Take 1 g by mouth daily.     FLUTICASONE (FLONASE) 50 MCG/ACT NASAL SPRAY    Place 2 sprays into the nose daily as needed.   LISINOPRIL (PRINIVIL,ZESTRIL) 10 MG TABLET    Take 1 tablet by mouth  daily   MULTIPLE VITAMIN (MULTIVITAMIN) TABLET    Take 1 tablet by mouth daily.   OMEPRAZOLE (PRILOSEC) 20 MG CAPSULE    Take 2 capsules (40 mg total) by mouth daily.   SILDENAFIL (VIAGRA) 100 MG TABLET    Take 1 tablet (100 mg total) by mouth daily as needed.   TURMERIC 450 MG CAPS    Take 1 tablet by mouth daily.  Modified Medications   No medications on file  Discontinued  Medications   No medications on file

## 2014-08-24 NOTE — Patient Instructions (Signed)
zostavax (SHINGLES VACCINE) -- check with insurance about getting it at our office or your pharmacy

## 2014-08-27 ENCOUNTER — Ambulatory Visit (INDEPENDENT_AMBULATORY_CARE_PROVIDER_SITE_OTHER)
Admission: RE | Admit: 2014-08-27 | Discharge: 2014-08-27 | Disposition: A | Discharge status: Home / Self Care | Payer: Self-pay | Source: Ambulatory Visit | Attending: Family Medicine | Admitting: Family Medicine

## 2014-08-27 DIAGNOSIS — Z8249 Family history of ischemic heart disease and other diseases of the circulatory system: Secondary | ICD-10-CM

## 2014-08-31 ENCOUNTER — Telehealth: Payer: Self-pay | Admitting: Family Medicine

## 2014-08-31 DIAGNOSIS — R931 Abnormal findings on diagnostic imaging of heart and coronary circulation: Secondary | ICD-10-CM

## 2014-08-31 NOTE — Addendum Note (Signed)
Addended by: Hannah Beat on: 08/31/2014 03:41 PM   Modules accepted: Orders

## 2014-08-31 NOTE — Telephone Encounter (Signed)
Patient said he was told he needs to see a Cardiologist.  Patient would like to be referred to Dr.Bensimhon. Patient can't go the week of 09/14/14.

## 2014-08-31 NOTE — Telephone Encounter (Signed)
done

## 2014-08-31 NOTE — Telephone Encounter (Signed)
Left message for James Watts to return my call.

## 2014-08-31 NOTE — Telephone Encounter (Signed)
Call the patient.  Dr. Nicholes Mango runs the advanced heart failure program for the hospital. He no longer does general cardiology or sees new patients who do not have advanced heart failure.   I am happy to make a suggestion myself, or he can ask Dr. Irving Burton again if that is his preference. I would start the ball rolling with this consult within the next few days.

## 2014-08-31 NOTE — Telephone Encounter (Signed)
James Watts notified as instructed by telephone.  He gave me the name of another cardiologist at [facility]: Dr. Darolyn Rua "[name]" Regino Schultze he would like to get in with.  Telephone number for Dr. Regino Schultze [phone].  He would like to get an appointment ASAP.

## 2014-08-31 NOTE — Telephone Encounter (Signed)
Left message at home number for James Watts to return my call.  Unable to reach his mobile number due to 336 area code change.

## 2014-09-10 ENCOUNTER — Emergency Department (HOSPITAL_COMMUNITY)
Admission: EM | Admit: 2014-09-10 | Discharge: 2014-09-10 | Disposition: A | Discharge status: Home / Self Care | Payer: 59 | Attending: Emergency Medicine | Admitting: Emergency Medicine

## 2014-09-10 ENCOUNTER — Encounter (HOSPITAL_COMMUNITY): Payer: Self-pay | Admitting: Emergency Medicine

## 2014-09-10 DIAGNOSIS — F418 Other specified anxiety disorders: Secondary | ICD-10-CM

## 2014-09-10 DIAGNOSIS — Z7982 Long term (current) use of aspirin: Secondary | ICD-10-CM | POA: Insufficient documentation

## 2014-09-10 DIAGNOSIS — I2510 Atherosclerotic heart disease of native coronary artery without angina pectoris: Secondary | ICD-10-CM | POA: Insufficient documentation

## 2014-09-10 DIAGNOSIS — F419 Anxiety disorder, unspecified: Secondary | ICD-10-CM | POA: Insufficient documentation

## 2014-09-10 DIAGNOSIS — E785 Hyperlipidemia, unspecified: Secondary | ICD-10-CM | POA: Diagnosis not present

## 2014-09-10 DIAGNOSIS — I10 Essential (primary) hypertension: Secondary | ICD-10-CM | POA: Insufficient documentation

## 2014-09-10 DIAGNOSIS — K219 Gastro-esophageal reflux disease without esophagitis: Secondary | ICD-10-CM | POA: Insufficient documentation

## 2014-09-10 DIAGNOSIS — Z79899 Other long term (current) drug therapy: Secondary | ICD-10-CM | POA: Insufficient documentation

## 2014-09-10 DIAGNOSIS — G479 Sleep disorder, unspecified: Secondary | ICD-10-CM | POA: Insufficient documentation

## 2014-09-10 LAB — I-STAT TROPONIN, ED: Troponin i, poc: 0.02 ng/mL (ref 0.00–0.08)

## 2014-09-10 NOTE — ED Notes (Signed)
Patient coming from home with a feeling of "feeling wired" or anxious since last night.  Denies chest pains, nausea and vomiting.

## 2014-09-10 NOTE — ED Provider Notes (Signed)
CSN: [id number]     Arrival date & time 09/10/14  0604 History   First MD Initiated Contact with Patient 09/10/14 [phone]     Chief Complaint  Patient presents with  . Anxiety     (Consider location/radiation/quality/duration/timing/severity/associated sxs/prior Treatment) HPI Comments: Patients with history of coronary artery disease, recent cardiac CT showing calcifications in some of his coronary arteries, follow-up with radiology in 2 weeks for further evaluation -- presents with complaint of anxiety and a restless feeling starting about 7 PM last night. Patient has not had any pain, including chest or abdominal pain. No pain in his neck, back, shoulders. No shortness of breath, palpitations, diaphoresis, nausea or vomiting. Patient does admit that he is worried about his cardiac CT results. Patient has no exertional chest pains. The onset of this condition was acute. The course is constant. Aggravating factors: none. Alleviating factors: none. No treatments prior to arrival.   The history is provided by the patient.    Past Medical History  Diagnosis Date  . HYPERLIPIDEMIA 02/25/2007    Qualifier: Diagnosis of  By: Deirdre Priest MD, Gaynell Face    . HYPERTENSION 02/25/2007    Qualifier: Diagnosis of  By: Deirdre Priest MD, Gaynell Face    . GERD (gastroesophageal reflux disease) 02/25/2007    Qualifier: Diagnosis of  By: Deirdre Priest MD, Gaynell Face    . Allergic rhinitis due to pollen 03/22/2011   Past Surgical History  Procedure Laterality Date  . Tonsillectomy     No family history on file. History  Substance Use Topics  . Smoking status: Never Smoker   . Smokeless tobacco: Never Used  . Alcohol Use: 0.0 oz/week    0 Standard drinks or equivalent per week     Comment: occassional    Review of Systems  Constitutional: Negative for fever and diaphoresis.  Eyes: Negative for redness.  Respiratory: Negative for cough and shortness of breath.   Cardiovascular: Negative for chest pain,  palpitations and leg swelling.  Gastrointestinal: Negative for nausea, vomiting and abdominal pain.  Genitourinary: Negative for dysuria.  Musculoskeletal: Negative for back pain and neck pain.  Skin: Negative for rash.  Neurological: Negative for syncope and light-headedness.  Psychiatric/Behavioral: Positive for sleep disturbance. The patient is nervous/anxious.       Allergies  Cefaclor  Home Medications   Prior to Admission medications   Medication Sig Start Date End Date Taking? Authorizing Provider  albuterol (PROVENTIL HFA;VENTOLIN HFA) 108 (90 BASE) MCG/ACT inhaler Inhale 2 puffs into the lungs every 6 (six) hours as needed for wheezing. 08/13/12   Hannah Beat, MD  aspirin 81 MG tablet Take 81 mg by mouth daily.      Historical Provider, MD  atorvastatin (LIPITOR) 20 MG tablet Take 1 tablet by mouth  daily 05/28/14   Hannah Beat, MD  Cinnamon 500 MG capsule Take 500 mg by mouth daily.    Historical Provider, MD  finasteride (PROSCAR) 5 MG tablet Take 5 mg by mouth daily.    Historical Provider, MD  fish oil-omega-3 fatty acids 1000 MG capsule Take 1 g by mouth daily.      Historical Provider, MD  fluticasone (FLONASE) 50 MCG/ACT nasal spray Place 2 sprays into the nose daily as needed. 04/13/11   Hannah Beat, MD  lisinopril (PRINIVIL,ZESTRIL) 10 MG tablet Take 1 tablet by mouth  daily 05/28/14   Hannah Beat, MD  Multiple Vitamin (MULTIVITAMIN) tablet Take 1 tablet by mouth daily.    Historical Provider, MD  omeprazole (PRILOSEC) 20 MG capsule  Take 2 capsules (40 mg total) by mouth daily. 05/28/14   Hannah Beat, MD  sildenafil (VIAGRA) 100 MG tablet Take 1 tablet (100 mg total) by mouth daily as needed. 04/01/12   Hannah Beat, MD  Turmeric 450 MG CAPS Take 1 tablet by mouth daily.    Historical Provider, MD   BP 168/82 mmHg  Pulse 52  Temp(Src) 98.6 F (37 C) (Oral)  Resp 18  Ht 6' (1.829 m)  Wt 245 lb (111.131 kg)  BMI 33.22 kg/m2  SpO2 99%    Physical Exam  Constitutional: He appears well-developed and well-nourished.  HENT:  Head: Normocephalic and atraumatic.  Mouth/Throat: Mucous membranes are normal. Mucous membranes are not dry.  Eyes: Conjunctivae are normal.  Neck: Trachea normal and normal range of motion. Neck supple. Normal carotid pulses and no JVD present. No muscular tenderness present. Carotid bruit is not present. No tracheal deviation present.  Cardiovascular: Normal rate, regular rhythm, S1 normal, S2 normal, normal heart sounds and intact distal pulses.  Exam reveals no distant heart sounds and no decreased pulses.   No murmur heard. Pulmonary/Chest: Effort normal and breath sounds normal. No respiratory distress. He has no wheezes. He exhibits no tenderness.  Abdominal: Soft. Normal aorta and bowel sounds are normal. There is no tenderness. There is no rebound and no guarding.  Musculoskeletal: He exhibits no edema.  Neurological: He is alert.  Skin: Skin is warm and dry. He is not diaphoretic. No cyanosis. No pallor.  Psychiatric: He has a normal mood and affect.  Nursing note and vitals reviewed.   ED Course  Procedures (including critical care time) Labs Review Labs Reviewed  Rosezena Sensor, ED    Imaging Review No results found.   EKG Interpretation   Date/Time:  Thursday Sep 10 2014 06:54:47 EDT Ventricular Rate:  61 PR Interval:  248 QRS Duration: 91 QT Interval:  416 QTC Calculation: 419 R Axis:   0 Text Interpretation:  Sinus rhythm Prolonged PR interval Low voltage,  precordial leads Abnormal R-wave progression, early transition Borderline  ST elevation, anterior leads Baseline wander in lead(s) III V6 agree. need  repeat for baseline arifact. Confirmed by Donnald Garre, MD, Lebron Conners [phone]) on  09/10/2014 7:33:39 AM       6:45 AM Patient seen and examined. Work-up initiated. Patient's concern is that he is having heart problem. He is low-risk and otherwise asymptomatic now. Will check  EKG and Troponin to address patient's concerns.   Vital signs reviewed and are as follows: BP 168/82 mmHg  Pulse 52  Temp(Src) 98.6 F (37 C) (Oral)  Resp 18  Ht 6' (1.829 m)  Wt 245 lb (111.131 kg)  BMI 33.22 kg/m2  SpO2 99%  8:34 AM Repeat EKG without any concerning features. Patient discussed with Dr. Patria Mane and Dr. Donnald Garre. Will d/c to home. Patient to follow-up with his cardiologist as planned in 2 weeks.  Patient was counseled to return with severe chest pain, especially if the pain is crushing or pressure-like and spreads to the arms, back, neck, or jaw, or if they have sweating, nausea, or shortness of breath with the pain. They were encouraged to call 911 with these symptoms.   They were also told to return if their chest pain gets worse and does not go away with rest, they have an attack of chest pain lasting longer than usual despite rest and treatment with the medications their caregiver has prescribed, if they wake from sleep with chest pain or shortness of breath,  if they feel dizzy or faint, if they have chest pain not typical of their usual pain, or if they have any other emergent concerns regarding their health.  The patient verbalized understanding and agreed.     MDM   Final diagnoses:  Anxiety about health   Patient with >10 hrs of anxiety, restlessness without any pain or other concerning ACS sx. EKG and troponin are reassuring. Exam is otherwise benign. Pt has appropriate follow-up.   No dangerous or life-threatening conditions suspected or identified by history, physical exam, and by work-up. No indications for hospitalization identified.      Renne Crigler, PA-C 09/10/14 [id number]  Azalia Bilis, MD 09/10/14 [phone]

## 2014-09-10 NOTE — Discharge Instructions (Signed)
Please read and follow all provided instructions.  Your diagnoses today include:  1. Anxiety about health     Tests performed today include:  An EKG of your heart - normal  Cardiac enzymes - a blood test for heart muscle damage, no sign of damage today  Vital signs. See below for your results today.   Medications prescribed:   None  Take any prescribed medications only as directed.  Follow-up instructions: Please follow-up with your primary care provider as soon as you can for further evaluation of your symptoms.   Return instructions:  SEEK IMMEDIATE MEDICAL ATTENTION IF:  You have severe chest pain, especially if the pain is crushing or pressure-like and spreads to the arms, back, neck, or jaw, or if you have sweating, nausea (feeling sick to your stomach), or shortness of breath. THIS IS AN EMERGENCY. Don't wait to see if the pain will go away. Get medical help at once. Call 911 or 0 (operator). DO NOT drive yourself to the hospital.   Your chest pain gets worse and does not go away with rest.   You have an attack of chest pain lasting longer than usual, despite rest and treatment with the medications your caregiver has prescribed.   You wake from sleep with chest pain or shortness of breath.  You feel dizzy or faint.  You have chest pain not typical of your usual pain for which you originally saw your caregiver.   You have any other emergent concerns regarding your health.  Your vital signs today were: BP 115/72 mmHg   Pulse 62   Temp(Src) 98.6 F (37 C) (Oral)   Resp 18   Ht 6' (1.829 m)   Wt 245 lb (111.131 kg)   BMI 33.22 kg/m2   SpO2 97% If your blood pressure (BP) was elevated above 135/85 this visit, please have this repeated by your doctor within one month. --------------

## 2014-10-16 ENCOUNTER — Other Ambulatory Visit: Payer: Self-pay

## 2014-10-16 MED ORDER — OMEPRAZOLE 20 MG PO CPDR: 40.0000 mg | DELAYED_RELEASE_CAPSULE | Freq: Every day | ORAL | Status: DC

## 2014-10-16 MED ORDER — LISINOPRIL 10 MG PO TABS: ORAL_TABLET | ORAL | Status: DC

## 2014-10-16 NOTE — Telephone Encounter (Signed)
pts ins coverage changed and pt has to use [facility] pharmacy for refills; pt request 90 day refill lisinopril and omeprazole to [facility].  Pt does not need lisinopril now but wants put on hold at [facility]. Advised pt done.spoke with Pam at [facility] and advised to hold lisinopril until pt calls for refill.

## 2014-12-24 ENCOUNTER — Telehealth: Payer: Self-pay | Admitting: Family Medicine

## 2014-12-24 NOTE — Telephone Encounter (Signed)
I would go to [facility] group, since they have had their own dedicated foot and ankle surgeons for decades and will be most familiar with this type of surgery.  [facility] will have to order PT with their protocols for [facility].

## 2014-12-24 NOTE — Telephone Encounter (Signed)
Called patient and gave him the phone number and address to [facility].

## 2014-12-24 NOTE — Telephone Encounter (Signed)
Patient called and left VM that he had his ankle surgery at [facility] and the surgeon wants him to have PT. He doesn't want to go to [facility] for the PT and would like a recommendation from you for someone in this area that he could go to. Please call patient with your recommendation of PT group.

## 2015-02-10 ENCOUNTER — Ambulatory Visit (INDEPENDENT_AMBULATORY_CARE_PROVIDER_SITE_OTHER): Payer: 59

## 2015-02-10 DIAGNOSIS — Z23 Encounter for immunization: Secondary | ICD-10-CM

## 2015-03-17 ENCOUNTER — Ambulatory Visit (INDEPENDENT_AMBULATORY_CARE_PROVIDER_SITE_OTHER): Payer: 59

## 2015-03-17 DIAGNOSIS — Z23 Encounter for immunization: Secondary | ICD-10-CM | POA: Diagnosis not present

## 2015-05-10 ENCOUNTER — Other Ambulatory Visit: Payer: Self-pay | Admitting: Family Medicine

## 2015-05-12 ENCOUNTER — Other Ambulatory Visit: Payer: Self-pay | Admitting: *Deleted

## 2015-05-12 MED ORDER — LISINOPRIL 10 MG PO TABS: 10.0000 mg | ORAL_TABLET | Freq: Every day | ORAL | Status: DC

## 2015-08-09 ENCOUNTER — Other Ambulatory Visit: Payer: Self-pay

## 2015-08-09 MED ORDER — ALBUTEROL SULFATE HFA 108 (90 BASE) MCG/ACT IN AERS: 2.0000 | INHALATION_SPRAY | Freq: Four times a day (QID) | RESPIRATORY_TRACT | Status: DC | PRN

## 2015-08-09 NOTE — Telephone Encounter (Signed)
Pt left v/m requesting refill albuterol inhaler for allergies; pt last annual 08/24/14. Refill x 1 and pt will cb for appt.

## 2015-09-09 ENCOUNTER — Telehealth: Payer: Self-pay

## 2015-09-09 NOTE — Telephone Encounter (Signed)
Pt going on trip and request early refill of Atorvastatin 40 mg taking one daily; med list has atorvastatin 20 mg; Dr Kyung Bacca Cardiologist at [facility] increased med; pt still seeing Dr Adriana Simas and will ck with Dr Patsey Berthold office about refill.

## 2015-09-14 NOTE — Telephone Encounter (Addendum)
Amy at [facility] left v/m; pt has been getting atorvastatin 40 mg taking one daily that was given to pt by Dr Glendell Docker; Dr Glendell Docker, cardiologist at [facility] is out of office this week and pt is leaving 09/17/15 for 2 months [place]. Amy wants to know if Dr Patsy Lager would fill the atorvastatin 40 mg for couple of months. Please advise.

## 2015-09-15 MED ORDER — ATORVASTATIN CALCIUM 40 MG PO TABS: 40.0000 mg | ORAL_TABLET | Freq: Every day | ORAL | Status: DC

## 2015-09-15 NOTE — Telephone Encounter (Signed)
lipitor 40 mg, 1 po daily, #30, 3 ref

## 2015-09-15 NOTE — Addendum Note (Signed)
Addended by: Damita Lack on: 09/15/2015 08:17 AM   Modules accepted: Orders

## 2015-11-24 ENCOUNTER — Other Ambulatory Visit (INDEPENDENT_AMBULATORY_CARE_PROVIDER_SITE_OTHER): Payer: BLUE CROSS/BLUE SHIELD

## 2015-11-24 DIAGNOSIS — Z125 Encounter for screening for malignant neoplasm of prostate: Secondary | ICD-10-CM

## 2015-11-24 DIAGNOSIS — Z79899 Other long term (current) drug therapy: Secondary | ICD-10-CM | POA: Diagnosis not present

## 2015-11-24 DIAGNOSIS — E785 Hyperlipidemia, unspecified: Secondary | ICD-10-CM

## 2015-11-24 LAB — CBC WITH DIFFERENTIAL/PLATELET
BASOS ABS: 0 10*3/uL (ref 0.0–0.1)
Basophils Relative: 0.4 % (ref 0.0–3.0)
EOS ABS: 0.3 10*3/uL (ref 0.0–0.7)
Eosinophils Relative: 3.4 % (ref 0.0–5.0)
HCT: 44.9 % (ref 39.0–52.0)
HEMOGLOBIN: 15.5 g/dL (ref 13.0–17.0)
LYMPHS ABS: 2.4 10*3/uL (ref 0.7–4.0)
Lymphocytes Relative: 26.7 % (ref 12.0–46.0)
MCHC: 34.6 g/dL (ref 30.0–36.0)
MCV: 88.6 fl (ref 78.0–100.0)
MONO ABS: 0.8 10*3/uL (ref 0.1–1.0)
Monocytes Relative: 8.3 % (ref 3.0–12.0)
NEUTROS PCT: 61.2 % (ref 43.0–77.0)
Neutro Abs: 5.6 10*3/uL (ref 1.4–7.7)
Platelets: 219 10*3/uL (ref 150.0–400.0)
RBC: 5.06 Mil/uL (ref 4.22–5.81)
RDW: 13.9 % (ref 11.5–15.5)
WBC: 9.1 10*3/uL (ref 4.0–10.5)

## 2015-11-24 LAB — HEPATIC FUNCTION PANEL
ALBUMIN: 4.1 g/dL (ref 3.5–5.2)
ALK PHOS: 81 U/L (ref 39–117)
ALT: 24 U/L (ref 0–53)
AST: 20 U/L (ref 0–37)
Bilirubin, Direct: 0.1 mg/dL (ref 0.0–0.3)
Total Bilirubin: 0.7 mg/dL (ref 0.2–1.2)
Total Protein: 6.8 g/dL (ref 6.0–8.3)

## 2015-11-24 LAB — BASIC METABOLIC PANEL
BUN: 15 mg/dL (ref 6–23)
CO2: 29 mEq/L (ref 19–32)
Calcium: 9.2 mg/dL (ref 8.4–10.5)
Chloride: 103 mEq/L (ref 96–112)
Creatinine, Ser: 1.04 mg/dL (ref 0.40–1.50)
GFR: 76.81 mL/min (ref >60.00)
Glucose, Bld: 96 mg/dL (ref 70–99)
Potassium: 4.1 mEq/L (ref 3.5–5.1)
SODIUM: 140 meq/L (ref 135–145)

## 2015-11-24 LAB — LIPID PANEL
CHOL/HDL RATIO: 4
Cholesterol: 150 mg/dL (ref 0–200)
HDL: 37.5 mg/dL — ABNORMAL LOW (ref >39.00)
LDL Cholesterol: 86 mg/dL (ref 0–99)
NONHDL: 112.56
Triglycerides: 135 mg/dL (ref 0.0–149.0)
VLDL: 27 mg/dL (ref 0.0–40.0)

## 2015-11-24 LAB — PSA: PSA: 1.39 ng/mL (ref 0.10–4.00)

## 2015-11-25 ENCOUNTER — Other Ambulatory Visit: Payer: Self-pay

## 2015-12-01 ENCOUNTER — Encounter: Payer: Self-pay | Admitting: Family Medicine

## 2015-12-01 ENCOUNTER — Ambulatory Visit (INDEPENDENT_AMBULATORY_CARE_PROVIDER_SITE_OTHER): Payer: BLUE CROSS/BLUE SHIELD | Admitting: Family Medicine

## 2015-12-01 VITALS — BP 104/60 | HR 78 | Temp 98.2°F | Ht 71.5 in | Wt 248.8 lb

## 2015-12-01 DIAGNOSIS — Z0000 Encounter for general adult medical examination without abnormal findings: Secondary | ICD-10-CM | POA: Diagnosis not present

## 2015-12-01 NOTE — Progress Notes (Signed)
Pre visit review using our clinic review tool, if applicable. No additional management support is needed unless otherwise documented below in the visit note. 

## 2015-12-01 NOTE — Progress Notes (Signed)
Dr. Frederico Hamman T. Jerelene Salaam, MD, [facility] [place], [address] Phone: [phone] Fax: [phone]  12/01/2015  Patient: James Watts, MRN: [id number], DOB: March 16, 1956, 60 y.o.  Primary Physician:  Owens Loffler, MD   Chief Complaint  Patient presents with  . Annual Exam   Subjective:   James Watts is a 60 y.o. pleasant patient who presents with the following:  Preventative Health Maintenance Visit:  Health Maintenance Summary Reviewed and updated, unless pt declines services.  Tobacco History Reviewed. Alcohol: No concerns, no excessive use Exercise Habits: Recent hiking trip, decreased after old ankle surgery STD concerns: no risk or activity to increase risk Drug Use: None Encouraged self-testicular check  Ear fullness  Health Maintenance  Topic Date Due  . INFLUENZA VACCINE  12/07/2015  . COLONOSCOPY  01/14/2017  . TETANUS/TDAP  07/12/2022  . ZOSTAVAX  Completed  . Hepatitis C Screening  Completed  . HIV Screening  Completed   Immunization History  Administered Date(s) Administered  . Influenza Split 03/22/2011, 02/13/2012  . Influenza Whole 02/25/2007, 03/03/2008, 03/22/2009, 04/06/2010  . Influenza,inj,Quad PF,36+ Mos 02/27/2013, 03/06/2014, 02/10/2015  . Td 03/22/2009  . Tdap 07/11/2012  . Zoster 03/17/2015   Patient Active Problem List   Diagnosis Date Noted  . Post-traumatic arthritis of ankle 08/13/2014  . Allergic rhinitis due to pollen 03/22/2011  . HYPERLIPIDEMIA 02/25/2007  . HYPERTENSION 02/25/2007  . GERD (gastroesophageal reflux disease) 02/25/2007   Past Medical History:  Diagnosis Date  . Allergic rhinitis due to pollen 03/22/2011  . GERD (gastroesophageal reflux disease) 02/25/2007   Qualifier: Diagnosis of  By: Erin Hearing MD, Ruthann Cancer    . HYPERLIPIDEMIA 02/25/2007   Qualifier: Diagnosis of  By: Erin Hearing MD, Ruthann Cancer    . HYPERTENSION 02/25/2007   Qualifier:  Diagnosis of  By: Erin Hearing MD, Ruthann Cancer     Past Surgical History:  Procedure Laterality Date  . TONSILLECTOMY     Social History   Social History  . Marital status: Married    Spouse name: N/A  . Number of children: N/A  . Years of education: N/A   Occupational History  . Not on file.   Social History Main Topics  . Smoking status: Never Smoker  . Smokeless tobacco: Never Used  . Alcohol use 0.0 oz/week     Comment: occassional  . Drug use: No  . Sexual activity: Not on file   Other Topics Concern  . Not on file   Social History Narrative  . No narrative on file   No family history on file. Allergies  Allergen Reactions  . Cefaclor     REACTION: unspecified    Medication list has been reviewed and updated.   General: Denies fever, chills, sweats. No significant weight loss. Eyes: Denies blurring,significant itching ENT: Denies earache, sore throat, and hoarseness. - ear as above Cardiovascular: Denies chest pains, palpitations, dyspnea on exertion Respiratory: Denies cough, dyspnea at rest,wheeezing Breast: no concerns about lumps GI: Denies nausea, vomiting, diarrhea, constipation, change in bowel habits, abdominal pain, melena, hematochezia GU: Denies penile discharge, ED, urinary flow / outflow problems. No STD concerns. Musculoskeletal: Denies back pain, joint pain, ankle doing well Derm: Denies rash, itching Neuro: Denies  paresthesias, frequent falls, frequent headaches Psych: Denies depression, anxiety Endocrine: Denies cold intolerance, heat intolerance, polydipsia Heme: Denies enlarged lymph nodes Allergy: No hayfever  Objective:   BP 104/60   Pulse 78   Temp 98.2 F (36.8 C) (Oral)  Ht 5' 11.5" (1.816 m)   Wt 248 lb 12 oz (112.8 kg)   BMI 34.21 kg/m  Ideal Body Weight: Weight in (lb) to have BMI = 25: 181.4  No exam data present  GEN: well developed, well nourished, no acute distress Eyes: conjunctiva and lids normal, PERRLA,  EOMI ENT: TM clear, nares clear, oral exam WNL Neck: supple, no lymphadenopathy, no thyromegaly, no JVD Pulm: clear to auscultation and percussion, respiratory effort normal CV: regular rate and rhythm, S1-S2, no murmur, rub or gallop, no bruits, peripheral pulses normal and symmetric, no cyanosis, clubbing, edema or varicosities GI: soft, non-tender; no hepatosplenomegaly, masses; active bowel sounds all quadrants GU: no hernia, testicular mass, penile discharge Lymph: no cervical, axillary or inguinal adenopathy MSK: gait normal, muscle tone and strength WNL, no joint swelling, effusions, discoloration, crepitus  SKIN: clear, good turgor, color WNL, no rashes, lesions, or ulcerations Neuro: normal mental status, normal strength, sensation, and motion Psych: alert; oriented to person, place and time, normally interactive and not anxious or depressed in appearance. All labs reviewed with patient.  Lipids:    Component Value Date/Time   CHOL 150 11/24/2015 0938   TRIG 135.0 11/24/2015 0938   HDL 37.50 (L) 11/24/2015 0938   LDLDIRECT 112.5 03/22/2011 1451   VLDL 27.0 11/24/2015 0938   CHOLHDL 4 11/24/2015 0938   CBC: CBC Latest Ref Rng & Units 11/24/2015 08/19/2014 03/27/2013  WBC 4.0 - 10.5 K/uL 9.1 8.3 8.5  Hemoglobin 13.0 - 17.0 g/dL 15.5 15.2 15.3  Hematocrit 39.0 - 52.0 % 44.9 44.5 44.4  Platelets 150.0 - 400.0 K/uL 219.0 211.0 694.8    Basic Metabolic Panel:    Component Value Date/Time   NA 140 11/24/2015 0938   K 4.1 11/24/2015 0938   CL 103 11/24/2015 0938   CO2 29 11/24/2015 0938   BUN 15 11/24/2015 0938   CREATININE 1.04 11/24/2015 0938   GLUCOSE 96 11/24/2015 0938   CALCIUM 9.2 11/24/2015 0938   Hepatic Function Latest Ref Rng & Units 11/24/2015 08/19/2014 03/27/2013  Total Protein 6.0 - 8.3 g/dL 6.8 6.9 6.8  Albumin 3.5 - 5.2 g/dL 4.1 4.1 4.0  AST 0 - 37 U/L _0 ALT 0 - 53 U/L 24 24 34  Alk Phosphatase 39 - 117 U/L 81 74 64  Total Bilirubin 0.2 - 1.2  mg/dL 0.7 0.7 1.2  Bilirubin, Direct 0.0 - 0.3 mg/dL 0.1 0.1 0.2    No results found for: TSH Lab Results  Component Value Date   PSA 1.39 11/24/2015   PSA 0.72 08/19/2014   PSA 1.29 03/27/2013    Assessment and Plan:   Health Maintenance Exam: The patient's preventative maintenance and recommended screening tests for an annual wellness exam were reviewed in full today. Brought up to date unless services declined.  Counselled on the importance of diet, exercise, and its role in overall health and mortality. The patient's FH and SH was reviewed, including their home life, tobacco status, and drug and alcohol status.  Follow-up: No Follow-up on file. Unless noted, follow-up in 1 year for Health Maintenance Exam.  Signed,  Frederico Hamman T. Janeliz Prestwood, MD   Patient's Medications  New Prescriptions   No medications on file  Previous Medications   ALBUTEROL (PROVENTIL HFA;VENTOLIN HFA) 108 (90 BASE) MCG/ACT INHALER    Inhale 2 puffs into the lungs every 6 (six) hours as needed for wheezing.   ASPIRIN 81 MG TABLET    Take 81 mg by mouth daily.  ATORVASTATIN (LIPITOR) 40 MG TABLET    Take 1 tablet (40 mg total) by mouth daily.   CINNAMON 500 MG CAPSULE    Take 500 mg by mouth daily.   FINASTERIDE (PROSCAR) 5 MG TABLET    Take 5 mg by mouth daily.   FISH OIL-OMEGA-3 FATTY ACIDS 1000 MG CAPSULE    Take 1 g by mouth daily.     FLUTICASONE (FLONASE) 50 MCG/ACT NASAL SPRAY    Place 2 sprays into the nose daily as needed.   LISINOPRIL (PRINIVIL,ZESTRIL) 10 MG TABLET    Take 1 tablet (10 mg total) by mouth daily.   MULTIPLE VITAMIN (MULTIVITAMIN) TABLET    Take 1 tablet by mouth daily.   OMEPRAZOLE (PRILOSEC) 20 MG CAPSULE    Take 2 capsules (40 mg total) by mouth daily.   SILDENAFIL (VIAGRA) 100 MG TABLET    Take 1 tablet (100 mg total) by mouth daily as needed.   TURMERIC 450 MG CAPS    Take 1 tablet by mouth daily.  Modified Medications   No medications on file  Discontinued Medications    No medications on file

## 2016-01-03 ENCOUNTER — Other Ambulatory Visit: Payer: Self-pay | Admitting: Family Medicine

## 2016-02-25 ENCOUNTER — Ambulatory Visit (INDEPENDENT_AMBULATORY_CARE_PROVIDER_SITE_OTHER): Payer: BLUE CROSS/BLUE SHIELD

## 2016-02-25 DIAGNOSIS — Z23 Encounter for immunization: Secondary | ICD-10-CM

## 2016-05-03 ENCOUNTER — Encounter: Payer: Self-pay | Admitting: Family Medicine

## 2016-05-03 ENCOUNTER — Ambulatory Visit (INDEPENDENT_AMBULATORY_CARE_PROVIDER_SITE_OTHER): Payer: BLUE CROSS/BLUE SHIELD | Admitting: Family Medicine

## 2016-05-03 VITALS — BP 132/78 | HR 73 | Temp 97.8°F | Ht 71.5 in | Wt 256.2 lb

## 2016-05-03 DIAGNOSIS — R413 Other amnesia: Secondary | ICD-10-CM

## 2016-05-03 NOTE — Progress Notes (Signed)
Pre visit review using our clinic review tool, if applicable. No additional management support is needed unless otherwise documented below in the visit note. 

## 2016-05-03 NOTE — Progress Notes (Signed)
Dr. Karleen HampshireSpencer T. Adel Burch, MD, [facility] [address] [place], [id number] Phone: [phone] Fax: [phone]  05/03/2016  Patient: James LoftRichard Huy, MRN: [id number], DOB: 07/22/1955, 60 y.o.  Primary Physician:  Hannah BeatSpencer Elysia Grand, MD   Chief Complaint  Patient presents with  . Memory Issues   Subjective:   James LoftRichard Watts is a 60 y.o. very pleasant male patient who presents with the following:  For the last year, forgetting things that he needs to do. More pronounced in previous years.  No ETOH or drugs.   Notably his wife is concerned about his memory. He has been forgetful multiple times when she is asked him to do things, and he is losing his keys. He has had not gotten lost in the community year made any major mistakes that he can think of. Family history with mentioned. No acute events, slurred speech, weakness, numbness, or other neurological changes.  No depression. No substance use at all. Things are going very well with this family.  Dad has dementia - not progressed. 62 years old.   Stopped working now.   Past Medical History, Surgical History, Social History, Family History, Problem List, Medications, and Allergies have been reviewed and updated if relevant.  Patient Active Problem List   Diagnosis Date Noted  . Post-traumatic arthritis of ankle 08/13/2014  . Allergic rhinitis due to pollen 03/22/2011  . HYPERLIPIDEMIA 02/25/2007  . HYPERTENSION 02/25/2007  . GERD (gastroesophageal reflux disease) 02/25/2007    Past Medical History:  Diagnosis Date  . Allergic rhinitis due to pollen 03/22/2011  . GERD (gastroesophageal reflux disease) 02/25/2007   Qualifier: Diagnosis of  By: Deirdre Priesthambliss MD, Gaynell FaceMarshall    . HYPERLIPIDEMIA 02/25/2007   Qualifier: Diagnosis of  By: Deirdre Priesthambliss MD, Gaynell FaceMarshall    . HYPERTENSION 02/25/2007   Qualifier: Diagnosis of  By: Deirdre Priesthambliss MD, Gaynell FaceMarshall      Past Surgical History:  Procedure  Laterality Date  . TONSILLECTOMY      Social History   Social History  . Marital status: Married    Spouse name: N/A  . Number of children: N/A  . Years of education: N/A   Occupational History  . Not on file.   Social History Main Topics  . Smoking status: Never Smoker  . Smokeless tobacco: Never Used  . Alcohol use 0.0 oz/week     Comment: occassional  . Drug use: No  . Sexual activity: Not on file   Other Topics Concern  . Not on file   Social History Narrative  . No narrative on file    No family history on file.  Allergies  Allergen Reactions  . Cefaclor     REACTION: unspecified    Medication list reviewed and updated in full in [facility].   GEN: No acute illnesses, no fevers, chills. GI: No n/v/d, eating normally Pulm: No SOB Interactive and getting along well at home.  Otherwise, ROS is as per the HPI.  Objective:   BP 132/78 (BP Location: Left Arm, Patient Position: Sitting, Cuff Size: Large)   Pulse 73   Temp 97.8 F (36.6 C) (Oral)   Ht 5' 11.5" (1.816 m)   Wt 256 lb 4 oz (116.2 kg)   SpO2 94%   BMI 35.24 kg/m   GEN: WDWN, NAD, Non-toxic, A & O x 3 HEENT: Atraumatic, Normocephalic. Neck supple. No masses, No LAD. Ears and Nose: No external deformity. CV: RRR, No M/G/R. No JVD. No thrill.  No extra heart sounds. PULM: CTA B, no wheezes, crackles, rhonchi. No retractions. No resp. distress. No accessory muscle use. EXTR: No c/c/e NEURO Normal gait.  PSYCH: Normally interactive. Conversant. Not depressed or anxious appearing.  Calm demeanor.   Neuro: CN 2-12 grossly intact. PERRLA. EOMI. Sensation intact throughout. Str 5/5 all extremities. DTR 2+. No clonus. A and o x 4. Romberg neg. Finger nose neg. Heel -shin neg.   Folstein MMSE: 29/30   Laboratory and Imaging Data: Results for orders placed or performed in visit on 11/24/15  Lipid panel  Result Value Ref Range   Cholesterol 150 0 - 200 mg/dL   Triglycerides 102.7 0.0 -  149.0 mg/dL   HDL 25.36 (L) >64.40 mg/dL   VLDL 34.7 0.0 - 42.5 mg/dL   LDL Cholesterol 86 0 - 99 mg/dL   Total CHOL/HDL Ratio 4    NonHDL 112.56   CBC with Differential/Platelet  Result Value Ref Range   WBC 9.1 4.0 - 10.5 K/uL   RBC 5.06 4.22 - 5.81 Mil/uL   Hemoglobin 15.5 13.0 - 17.0 g/dL   HCT 95.6 38.7 - 56.4 %   MCV 88.6 78.0 - 100.0 fl   MCHC 34.6 30.0 - 36.0 g/dL   RDW 33.2 95.1 - 88.4 %   Platelets 219.0 150.0 - 400.0 K/uL   Neutrophils Relative % 61.2 43.0 - 77.0 %   Lymphocytes Relative 26.7 12.0 - 46.0 %   Monocytes Relative 8.3 3.0 - 12.0 %   Eosinophils Relative 3.4 0.0 - 5.0 %   Basophils Relative 0.4 0.0 - 3.0 %   Neutro Abs 5.6 1.4 - 7.7 K/uL   Lymphs Abs 2.4 0.7 - 4.0 K/uL   Monocytes Absolute 0.8 0.1 - 1.0 K/uL   Eosinophils Absolute 0.3 0.0 - 0.7 K/uL   Basophils Absolute 0.0 0.0 - 0.1 K/uL  Basic metabolic panel  Result Value Ref Range   Sodium 140 135 - 145 mEq/L   Potassium 4.1 3.5 - 5.1 mEq/L   Chloride 103 96 - 112 mEq/L   CO2 29 19 - 32 mEq/L   Glucose, Bld 96 70 - 99 mg/dL   BUN 15 6 - 23 mg/dL   Creatinine, Ser 1.66 0.40 - 1.50 mg/dL   Calcium 9.2 8.4 - 06.3 mg/dL   GFR 01.60 >10.93 mL/min  Hepatic function panel  Result Value Ref Range   Total Bilirubin 0.7 0.2 - 1.2 mg/dL   Bilirubin, Direct 0.1 0.0 - 0.3 mg/dL   Alkaline Phosphatase 81 39 - 117 U/L   AST 20 0 - 37 U/L   ALT 24 0 - 53 U/L   Total Protein 6.8 6.0 - 8.3 g/dL   Albumin 4.1 3.5 - 5.2 g/dL  PSA  Result Value Ref Range   PSA 1.39 0.10 - 4.00 ng/mL     Assessment and Plan:   Memory changes  >25 minutes spent in face to face time with patient, >50% spent in counselling or coordination of care  He is having some subtle changes, is unclear if this is normal for aging or if he has some early stage dementia. He did get a 29 out of 30 on his Folstein Mini-Mental Status exam. No focal neurological deficits. We talked about options, and he also has been less active since he  retired. Recommended engaging more in the community, and being more physically active, and he like to do that for the next 4-6 months and see how he is doing. If he worsens  or has concerns, then we can always set him up with neurology also.  Follow-up: No Follow-up on file.  Signed,  Elpidio Galea. Broden Holt, MD   Allergies as of 05/03/2016      Reactions   Cefaclor    REACTION: unspecified      Medication List       Accurate as of 05/03/16  1:46 PM. Always use your most recent med list.          albuterol 108 (90 Base) MCG/ACT inhaler Commonly known as:  PROVENTIL HFA;VENTOLIN HFA Inhale 2 puffs into the lungs every 6 (six) hours as needed for wheezing.   aspirin 81 MG tablet Take 81 mg by mouth daily.   atorvastatin 40 MG tablet Commonly known as:  LIPITOR TAKE 1 TABLET BY MOUTH DAILY   Cinnamon 500 MG capsule Take 500 mg by mouth daily.   finasteride 5 MG tablet Commonly known as:  PROSCAR Take 5 mg by mouth daily.   fish oil-omega-3 fatty acids 1000 MG capsule Take 1 g by mouth daily.   fluticasone 50 MCG/ACT nasal spray Commonly known as:  FLONASE Place 2 sprays into the nose daily as needed.   lisinopril 10 MG tablet Commonly known as:  PRINIVIL,ZESTRIL Take 1 tablet (10 mg total) by mouth daily.   multivitamin tablet Take 1 tablet by mouth daily.   omeprazole 20 MG capsule Commonly known as:  PRILOSEC Take 2 capsules (40 mg total) by mouth daily.   sildenafil 100 MG tablet Commonly known as:  VIAGRA Take 1 tablet (100 mg total) by mouth daily as needed.   Turmeric 450 MG Caps Take 1 tablet by mouth daily.

## 2016-05-09 ENCOUNTER — Ambulatory Visit (INDEPENDENT_AMBULATORY_CARE_PROVIDER_SITE_OTHER): Payer: BLUE CROSS/BLUE SHIELD | Admitting: Internal Medicine

## 2016-05-09 ENCOUNTER — Encounter: Payer: Self-pay | Admitting: Internal Medicine

## 2016-05-09 VITALS — BP 124/80 | HR 63 | Temp 98.7°F | Wt 252.0 lb

## 2016-05-09 DIAGNOSIS — R05 Cough: Secondary | ICD-10-CM

## 2016-05-09 DIAGNOSIS — R059 Cough, unspecified: Secondary | ICD-10-CM

## 2016-05-09 DIAGNOSIS — R0982 Postnasal drip: Secondary | ICD-10-CM | POA: Diagnosis not present

## 2016-05-09 NOTE — Progress Notes (Signed)
Subjective:    Patient ID: James Watts, male    DOB: 1956-05-05, 61 y.o.   MRN: [id number]  HPI  Pt presents to the clinic today with c/o cough. This started 2 months ago. It has been intermittent since that time. He denies runny nose, nasal congestion, sore throat or cough. He has noticed some postnasal drip. The cough is non productive. He denies fever, chills or shortness of breath. He has tried Albuterol, Claritin and Nyquil with minimal relief. He has a history of seasonal allergies, worse in the fall and spring. He has had sick contacts. He is UTD on his flu shot.  Review of Systems      Past Medical History:  Diagnosis Date  . Allergic rhinitis due to pollen 03/22/2011  . GERD (gastroesophageal reflux disease) 02/25/2007   Qualifier: Diagnosis of  By: Deirdre Priest MD, Gaynell Face    . HYPERLIPIDEMIA 02/25/2007   Qualifier: Diagnosis of  By: Deirdre Priest MD, Gaynell Face    . HYPERTENSION 02/25/2007   Qualifier: Diagnosis of  By: Deirdre Priest MD, Gaynell Face      Current Outpatient Prescriptions  Medication Sig Dispense Refill  . albuterol (PROVENTIL HFA;VENTOLIN HFA) 108 (90 Base) MCG/ACT inhaler Inhale 2 puffs into the lungs every 6 (six) hours as needed for wheezing. 1 Inhaler 1  . aspirin 81 MG tablet Take 81 mg by mouth daily.      Marland Kitchen atorvastatin (LIPITOR) 40 MG tablet TAKE 1 TABLET BY MOUTH DAILY 180 tablet 1  . Cinnamon 500 MG capsule Take 500 mg by mouth daily.    . finasteride (PROSCAR) 5 MG tablet Take 5 mg by mouth daily.    . fish oil-omega-3 fatty acids 1000 MG capsule Take 1 g by mouth daily.      Marland Kitchen lisinopril (PRINIVIL,ZESTRIL) 10 MG tablet Take 1 tablet (10 mg total) by mouth daily. 180 tablet 1  . Multiple Vitamin (MULTIVITAMIN) tablet Take 1 tablet by mouth daily.    Marland Kitchen omeprazole (PRILOSEC) 20 MG capsule Take 2 capsules (40 mg total) by mouth daily. 180 capsule 1  . sildenafil (VIAGRA) 100 MG tablet Take 1 tablet (100 mg total) by mouth daily as needed. 30 tablet 3  .  Turmeric 450 MG CAPS Take 1 tablet by mouth daily.    . fluticasone (FLONASE) 50 MCG/ACT nasal spray Place 2 sprays into the nose daily as needed. (Patient not taking: Reported on 05/09/2016) 16 g 2   No current facility-administered medications for this visit.     Allergies  Allergen Reactions  . Cefaclor     REACTION: unspecified    No family history on file.  Social History   Social History  . Marital status: Married    Spouse name: N/A  . Number of children: N/A  . Years of education: N/A   Occupational History  . Not on file.   Social History Main Topics  . Smoking status: Never Smoker  . Smokeless tobacco: Never Used  . Alcohol use 0.0 oz/week     Comment: occassional  . Drug use: No  . Sexual activity: Not on file   Other Topics Concern  . Not on file   Social History Narrative  . No narrative on file     Constitutional: Denies fever, malaise, fatigue, headache or abrupt weight changes.  HEENT: Denies eye pain, eye redness, ear pain, ringing in the ears, wax buildup, runny nose, nasal congestion, bloody nose, or sore throat. Respiratory: Pt reports cough. Denies difficulty breathing, shortness of breath,  cough or sputum production.   Cardiovascular: Denies chest pain, chest tightness, palpitations or swelling in the hands or feet.    No other specific complaints in a complete review of systems (except as listed in HPI above).  Objective:   Physical Exam  BP 124/80   Pulse 63   Temp 98.7 F (37.1 C) (Oral)   Wt 252 lb (114.3 kg)   SpO2 98%   BMI 34.66 kg/m  Wt Readings from Last 3 Encounters:  05/09/16 252 lb (114.3 kg)  05/03/16 256 lb 4 oz (116.2 kg)  12/01/15 248 lb 12 oz (112.8 kg)    General: Appears his stated age, in NAD. HEENT: Head: normal shape and size; Ears: bilateral cerumen buildup; Nose: mucosa pink and moist, septum midline; Throat/Mouth: Teeth present, mucosa pink and moist, + PND, no exudate, lesions or ulcerations noted.    Neck:  No adenopathy noted.  Cardiovascular: Normal rate and rhythm. S1,S2 noted.   Pulmonary/Chest: Normal effort and positive vesicular breath sounds. No respiratory distress. No wheezes, rales or ronchi noted.    BMET    Component Value Date/Time   NA 140 11/24/2015 0938   K 4.1 11/24/2015 0938   CL 103 11/24/2015 0938   CO2 29 11/24/2015 0938   GLUCOSE 96 11/24/2015 0938   BUN 15 11/24/2015 0938   CREATININE 1.04 11/24/2015 0938   CALCIUM 9.2 11/24/2015 0938    Lipid Panel     Component Value Date/Time   CHOL 150 11/24/2015 0938   TRIG 135.0 11/24/2015 0938   HDL 37.50 (L) 11/24/2015 0938   CHOLHDL 4 11/24/2015 0938   VLDL 27.0 11/24/2015 0938   LDLCALC 86 11/24/2015 0938    CBC    Component Value Date/Time   WBC 9.1 11/24/2015 0938   RBC 5.06 11/24/2015 0938   HGB 15.5 11/24/2015 0938   HCT 44.9 11/24/2015 0938   PLT 219.0 11/24/2015 0938   MCV 88.6 11/24/2015 0938   MCHC 34.6 11/24/2015 0938   RDW 13.9 11/24/2015 0938   LYMPHSABS 2.4 11/24/2015 0938   MONOABS 0.8 11/24/2015 0938   EOSABS 0.3 11/24/2015 0938   BASOSABS 0.0 11/24/2015 0938    Hgb A1C No results found for: HGBA1C       Assessment & Plan:   Cough secondary to PND:  Stop Claritin Start Zyrtec daily OTC If no improvement in 4 weeks, consider spirometry and referral to allergy for further testing  RTC as needed or if symptoms persist or worsen Shakesha Soltau, NP

## 2016-05-09 NOTE — Patient Instructions (Signed)
Cough, Adult Introduction A cough helps to clear your throat and lungs. A cough may last only 2-3 weeks (acute), or it may last longer than 8 weeks (chronic). Many different things can cause a cough. A cough may be a sign of an illness or another medical condition. Follow these instructions at home:  Pay attention to any changes in your cough.  Take medicines only as told by your doctor.  If you were prescribed an antibiotic medicine, take it as told by your doctor. Do not stop taking it even if you start to feel better.  Talk with your doctor before you try using a cough medicine.  Drink enough fluid to keep your pee (urine) clear or pale yellow.  If the air is dry, use a cold steam vaporizer or humidifier in your home.  Stay away from things that make you cough at work or at home.  If your cough is worse at night, try using extra pillows to raise your head up higher while you sleep.  Do not smoke, and try not to be around smoke. If you need help quitting, ask your doctor.  Do not have caffeine.  Do not drink alcohol.  Rest as needed. Contact a doctor if:  You have new problems (symptoms).  You cough up yellow fluid (pus).  Your cough does not get better after 2-3 weeks, or your cough gets worse.  Medicine does not help your cough and you are not sleeping well.  You have pain that gets worse or pain that is not helped with medicine.  You have a fever.  You are losing weight and you do not know why.  You have night sweats. Get help right away if:  You cough up blood.  You have trouble breathing.  Your heartbeat is very fast. This information is not intended to replace advice given to you by your health care provider. Make sure you discuss any questions you have with your health care provider. Document Released: 01/05/2011 Document Revised: 09/30/2015 Document Reviewed: 07/01/2014  2017 Elsevier  

## 2016-06-12 ENCOUNTER — Telehealth: Payer: Self-pay | Admitting: Family Medicine

## 2016-06-12 DIAGNOSIS — R05 Cough: Secondary | ICD-10-CM

## 2016-06-12 DIAGNOSIS — R053 Chronic cough: Secondary | ICD-10-CM

## 2016-06-12 NOTE — Addendum Note (Signed)
Addended by: Lorre Munroe on: 06/12/2016 11:36 AM   Modules accepted: Orders

## 2016-06-12 NOTE — Telephone Encounter (Signed)
Pt would like referral to allergist for dry cough Pt prefers [facility]  cb number is [phone]

## 2016-06-12 NOTE — Telephone Encounter (Signed)
Referral placed.

## 2016-06-18 ENCOUNTER — Other Ambulatory Visit: Payer: Self-pay | Admitting: Family Medicine

## 2016-07-25 ENCOUNTER — Other Ambulatory Visit: Payer: Self-pay | Admitting: Allergy

## 2016-07-25 ENCOUNTER — Ambulatory Visit
Admission: RE | Admit: 2016-07-25 | Discharge: 2016-07-25 | Disposition: A | Discharge status: Home / Self Care | Payer: BLUE CROSS/BLUE SHIELD | Source: Ambulatory Visit | Attending: Allergy | Admitting: Allergy

## 2016-07-25 DIAGNOSIS — R05 Cough: Secondary | ICD-10-CM

## 2016-07-25 DIAGNOSIS — R059 Cough, unspecified: Secondary | ICD-10-CM

## 2016-08-09 ENCOUNTER — Ambulatory Visit (INDEPENDENT_AMBULATORY_CARE_PROVIDER_SITE_OTHER): Payer: BLUE CROSS/BLUE SHIELD | Admitting: Family Medicine

## 2016-08-09 ENCOUNTER — Ambulatory Visit (INDEPENDENT_AMBULATORY_CARE_PROVIDER_SITE_OTHER)
Admission: RE | Admit: 2016-08-09 | Discharge: 2016-08-09 | Disposition: A | Discharge status: Home / Self Care | Payer: BLUE CROSS/BLUE SHIELD | Source: Ambulatory Visit | Attending: Family Medicine | Admitting: Family Medicine

## 2016-08-09 ENCOUNTER — Encounter: Payer: Self-pay | Admitting: Family Medicine

## 2016-08-09 VITALS — BP 120/80 | HR 72 | Temp 98.4°F | Ht 71.5 in | Wt 251.5 lb

## 2016-08-09 DIAGNOSIS — M7522 Bicipital tendinitis, left shoulder: Secondary | ICD-10-CM

## 2016-08-09 DIAGNOSIS — M7582 Other shoulder lesions, left shoulder: Secondary | ICD-10-CM

## 2016-08-09 DIAGNOSIS — M7552 Bursitis of left shoulder: Secondary | ICD-10-CM | POA: Diagnosis not present

## 2016-08-09 DIAGNOSIS — M25512 Pain in left shoulder: Secondary | ICD-10-CM

## 2016-08-09 NOTE — Progress Notes (Signed)
Dr. Karleen Hampshire T. Ajax Schroll, MD, CAQ Sports Medicine Primary Care and Sports Medicine [address] [place], [address] Phone: [phone] Fax: [phone]  08/09/2016  Patient: James Watts, MRN: [id number], DOB: Feb 21, 1956, 61 y.o.  Primary Physician:  Hannah Beat, MD   Chief Complaint  Patient presents with  . Shoulder Pain    Left x 1 month   Subjective:   This 62 y.o. male patient noted above presents with shoulder pain that has been ongoing for 1 mo. there is no history of trauma or accident recently The patient denies neck pain or radicular symptoms. Denies dislocation, subluxation, separation of the shoulder. The patient does complain of pain in the overhead plane with significant painful arc of motion.  Spread 15 yards of mulch and using the power washer.  Took some Tylenol and done some home rehab.   Painful arc of motion Biceps / speeds positive Crossover positive  Medications Tried: Tylenol, NSAIDS Ice or Heat: minimally helpful Tried PT: No  Prior shoulder Injury: No Prior surgery: No Prior fracture: No  The PMH, PSH, Social History, Family History, Medications, and allergies have been reviewed in [facility], and have been updated if relevant.  Patient Active Problem List   Diagnosis Date Noted  . Post-traumatic arthritis of ankle 08/13/2014  . Allergic rhinitis due to pollen 03/22/2011  . HYPERLIPIDEMIA 02/25/2007  . HYPERTENSION 02/25/2007  . GERD (gastroesophageal reflux disease) 02/25/2007    Past Medical History:  Diagnosis Date  . Allergic rhinitis due to pollen 03/22/2011  . GERD (gastroesophageal reflux disease) 02/25/2007   Qualifier: Diagnosis of  By: Deirdre Priest MD, Gaynell Face    . HYPERLIPIDEMIA 02/25/2007   Qualifier: Diagnosis of  By: Deirdre Priest MD, Gaynell Face    . HYPERTENSION 02/25/2007   Qualifier: Diagnosis of  By: Deirdre Priest MD, Gaynell Face      Past Surgical History:  Procedure Laterality Date  . TONSILLECTOMY      Social  History   Social History  . Marital status: Married    Spouse name: N/A  . Number of children: N/A  . Years of education: N/A   Occupational History  . Not on file.   Social History Main Topics  . Smoking status: Never Smoker  . Smokeless tobacco: Never Used  . Alcohol use 0.0 oz/week     Comment: occassional  . Drug use: No  . Sexual activity: Not on file   Other Topics Concern  . Not on file   Social History Narrative  . No narrative on file    No family history on file.  Allergies  Allergen Reactions  . Cefaclor     REACTION: unspecified    Medication list reviewed and updated in full in [facility].  GEN: No fevers, chills. Nontoxic. Primarily MSK c/o today. MSK: Detailed in the HPI GI: tolerating PO intake without difficulty Neuro: No numbness, parasthesias, or tingling associated. Otherwise the pertinent positives of the ROS are noted above.   Objective:   Blood pressure 120/80, pulse 72, temperature 98.4 F (36.9 C), temperature source Oral, height 5' 11.5" (1.816 m), weight 251 lb 8 oz (114.1 kg).  GEN: Well-developed,well-nourished,in no acute distress; alert,appropriate and cooperative throughout examination HEENT: Normocephalic and atraumatic without obvious abnormalities. Ears, externally no deformities PULM: Breathing comfortably in no respiratory distress EXT: No clubbing, cyanosis, or edema PSYCH: Normally interactive. Cooperative during the interview. Pleasant. Friendly and conversant. Not anxious or depressed appearing. Normal, full affect.  Shoulder: L Inspection: No muscle wasting or winging  Ecchymosis/edema: neg  AC joint, scapula, clavicle: NT Cervical spine: NT, full ROM Spurling's: neg Abduction: full, 5/5 Flexion: full, 5/5 IR, full, lift-off: 5/5 ER at neutral: full, 5/5 AC crossover: pos Neer: pos Hawkins: pos Drop Test: neg Empty Can: pos Supraspinatus insertion: mild-mod T Bicipital groove: NT Speed's:  pos Yergason's: neg Sulcus sign: neg Scapular dyskinesis: none C5-T1 intact  Neuro: Sensation intact Grip 5/5   Radiology: Dg Shoulder Left  Result Date: 08/09/2016 CLINICAL DATA:  Left shoulder pain for 1 month.  Repetitive motion. EXAM: LEFT SHOULDER - 2+ VIEW COMPARISON:  None. FINDINGS: No fracture. No left glenohumeral joint dislocation. No suspicious focal osseous lesion. No evidence of left acromioclavicular separation. Small spur at the inferior lateral left clavicular head. No appreciable degenerative or erosive arthropathy in the left acromioclavicular or left glenohumeral joints. No pathologic soft tissue calcifications. IMPRESSION: No fracture or malalignment. Small spur at the inferior lateral left clavicular head. Electronically Signed   By: Delbert Phenix M.D.   On: 08/09/2016 08:56    Assessment and Plan:    Rotator cuff tendonitis, left - Plan: Ambulatory referral to Physical Therapy  Left shoulder pain, unspecified chronicity - Plan: DG Shoulder Left, Ambulatory referral to Physical Therapy  Subacromial bursitis of left shoulder joint - Plan: Ambulatory referral to Physical Therapy  Biceps tendonitis, left - Plan: Ambulatory referral to Physical Therapy  Rotator cuff strengthening and scapular stabilization exercises were reviewed with the patient.   Retraining shoulder mechanics and function was emphasized to the patient with rehab done at least 5-6 days a week.  The patient could benefit from formal PT to assist with scapular stabilization and RTC strengthening.   Follow-up: No Follow-up on file.  Orders Placed This Encounter  Procedures  . DG Shoulder Left  . Ambulatory referral to Physical Therapy    Signed,  Karleen Hampshire T. Emilyrose Darrah, MD   Patient's Medications  New Prescriptions   No medications on file  Previous Medications   ALBUTEROL (PROVENTIL HFA;VENTOLIN HFA) 108 (90 BASE) MCG/ACT INHALER    Inhale 2 puffs into the lungs every 6 (six) hours as needed  for wheezing.   ASPIRIN 81 MG TABLET    Take 81 mg by mouth daily.     ATORVASTATIN (LIPITOR) 40 MG TABLET    TAKE 1 TABLET BY MOUTH DAILY   CINNAMON 500 MG CAPSULE    Take 500 mg by mouth daily.   FINASTERIDE (PROSCAR) 5 MG TABLET    Take 5 mg by mouth daily.   FISH OIL-OMEGA-3 FATTY ACIDS 1000 MG CAPSULE    Take 1 g by mouth daily.     FLUTICASONE (FLONASE) 50 MCG/ACT NASAL SPRAY    Place 2 sprays into the nose daily as needed.   LEVOCETIRIZINE (XYZAL) 5 MG TABLET    Take 5 mg by mouth every evening.   LISINOPRIL (PRINIVIL,ZESTRIL) 10 MG TABLET    TAKE 1 TABLET BY MOUTH DAILY   MONTELUKAST (SINGULAIR) 10 MG TABLET    Take 10 mg by mouth at bedtime.   MULTIPLE VITAMIN (MULTIVITAMIN) TABLET    Take 1 tablet by mouth daily.   OMEPRAZOLE (PRILOSEC) 20 MG CAPSULE    Take 2 capsules (40 mg total) by mouth daily.   SILDENAFIL (VIAGRA) 100 MG TABLET    Take 1 tablet (100 mg total) by mouth daily as needed.   TURMERIC 450 MG CAPS    Take 1 tablet by mouth daily.  Modified Medications   No medications on file  Discontinued Medications   No medications on file

## 2016-08-09 NOTE — Progress Notes (Signed)
Pre visit review using our clinic review tool, if applicable. No additional management support is needed unless otherwise documented below in the visit note. 

## 2016-08-09 NOTE — Patient Instructions (Signed)

## 2016-12-25 ENCOUNTER — Other Ambulatory Visit: Payer: Self-pay | Admitting: Family Medicine

## 2016-12-26 ENCOUNTER — Other Ambulatory Visit: Payer: Self-pay | Admitting: Family Medicine

## 2017-01-19 ENCOUNTER — Other Ambulatory Visit: Payer: Self-pay | Admitting: Family Medicine

## 2017-02-01 ENCOUNTER — Other Ambulatory Visit: Payer: Self-pay | Admitting: Family Medicine

## 2017-02-21 ENCOUNTER — Ambulatory Visit (INDEPENDENT_AMBULATORY_CARE_PROVIDER_SITE_OTHER): Payer: BLUE CROSS/BLUE SHIELD

## 2017-02-21 DIAGNOSIS — Z23 Encounter for immunization: Secondary | ICD-10-CM

## 2017-03-06 LAB — HM COLONOSCOPY

## 2017-03-07 ENCOUNTER — Encounter: Payer: Self-pay | Admitting: Family Medicine

## 2017-03-10 ENCOUNTER — Other Ambulatory Visit: Payer: Self-pay | Admitting: Family Medicine

## 2017-03-15 ENCOUNTER — Ambulatory Visit (INDEPENDENT_AMBULATORY_CARE_PROVIDER_SITE_OTHER): Payer: BLUE CROSS/BLUE SHIELD | Admitting: Family Medicine

## 2017-03-15 ENCOUNTER — Other Ambulatory Visit: Payer: Self-pay

## 2017-03-15 ENCOUNTER — Encounter: Payer: Self-pay | Admitting: Family Medicine

## 2017-03-15 VITALS — BP 120/76 | HR 80 | Temp 98.3°F | Ht 71.5 in | Wt 250.8 lb

## 2017-03-15 DIAGNOSIS — I10 Essential (primary) hypertension: Secondary | ICD-10-CM | POA: Diagnosis not present

## 2017-03-15 DIAGNOSIS — E782 Mixed hyperlipidemia: Secondary | ICD-10-CM

## 2017-03-15 MED ORDER — LISINOPRIL 10 MG PO TABS: 10.0000 mg | ORAL_TABLET | Freq: Every day | ORAL | 3 refills | Status: DC

## 2017-03-15 MED ORDER — ATORVASTATIN CALCIUM 40 MG PO TABS: 40.0000 mg | ORAL_TABLET | Freq: Every day | ORAL | 3 refills | Status: DC

## 2017-03-15 NOTE — Progress Notes (Signed)
Dr. Karleen HampshireSpencer T. Lachae Hohler, MD, CAQ Sports Medicine Primary Care and Sports Medicine [address] [place], [address] Phone: [phone] Fax: [phone]  03/15/2017  Patient: James LoftRichard Watts, MRN: [id number], DOB: [birth year], 61 y.o.  Primary Physician:  Hannah Beatopland, Naylin Burkle, MD   Chief Complaint  Patient presents with  . Medication Refill   Subjective:   James Watts is a 61 y.o. very pleasant male patient who presents with the following:  F/u, needs chol and BP meds  HTN: Tolerating all medications without side effects Stable and at goal No CP, no sob. No HA.  BP Readings from Last 3 Encounters:  03/15/17 120/76  08/09/16 120/80  05/09/16 124/80    Basic Metabolic Panel:    Component Value Date/Time   NA 140 11/24/2015 0938   K 4.1 11/24/2015 0938   CL 103 11/24/2015 0938   CO2 29 11/24/2015 0938   BUN 15 11/24/2015 0938   CREATININE 1.04 11/24/2015 0938   GLUCOSE 96 11/24/2015 0938   CALCIUM 9.2 11/24/2015 0938     Lipids: Doing well, stable. Tolerating meds fine with no SE. Panel reviewed with patient.  Lipids:    Component Value Date/Time   CHOL 150 11/24/2015 0938   TRIG 135.0 11/24/2015 0938   HDL 37.50 (L) 11/24/2015 0938   LDLDIRECT 112.5 03/22/2011 1451   VLDL 27.0 11/24/2015 0938   CHOLHDL 4 11/24/2015 0938    Lab Results  Component Value Date   ALT 24 11/24/2015   AST 20 11/24/2015   ALKPHOS 81 11/24/2015   BILITOT 0.7 11/24/2015     Past Medical History, Surgical History, Social History, Family History, Problem List, Medications, and Allergies have been reviewed and updated if relevant.  Patient Active Problem List   Diagnosis Date Noted  . Post-traumatic arthritis of ankle 08/13/2014  . Allergic rhinitis due to pollen 03/22/2011  . HYPERLIPIDEMIA 02/25/2007  . HYPERTENSION 02/25/2007  . GERD (gastroesophageal reflux disease) 02/25/2007    Past Medical History:  Diagnosis Date  . Allergic rhinitis due to pollen  03/22/2011  . GERD (gastroesophageal reflux disease) 02/25/2007   Qualifier: Diagnosis of  By: Deirdre Priesthambliss MD, Gaynell FaceMarshall    . HYPERLIPIDEMIA 02/25/2007   Qualifier: Diagnosis of  By: Deirdre Priesthambliss MD, Gaynell FaceMarshall    . HYPERTENSION 02/25/2007   Qualifier: Diagnosis of  By: Deirdre Priesthambliss MD, Gaynell FaceMarshall      Past Surgical History:  Procedure Laterality Date  . TONSILLECTOMY      Social History   Socioeconomic History  . Marital status: Married    Spouse name: Not on file  . Number of children: Not on file  . Years of education: Not on file  . Highest education level: Not on file  Social Needs  . Financial resource strain: Not on file  . Food insecurity - worry: Not on file  . Food insecurity - inability: Not on file  . Transportation needs - medical: Not on file  . Transportation needs - non-medical: Not on file  Occupational History  . Not on file  Tobacco Use  . Smoking status: Never Smoker  . Smokeless tobacco: Never Used  Substance and Sexual Activity  . Alcohol use: Yes    Alcohol/week: 0.0 oz    Comment: occassional  . Drug use: No  . Sexual activity: Not on file  Other Topics Concern  . Not on file  Social History Narrative  . Not on file    History reviewed. No pertinent family history.  Allergies  Allergen Reactions  .  Cefaclor     REACTION: unspecified    Medication list reviewed and updated in full in [facility].   GEN: No acute illnesses, no fevers, chills. GI: No n/v/d, eating normally Pulm: No SOB Interactive and getting along well at home.  Otherwise, ROS is as per the HPI.  Objective:   BP 120/76   Pulse 80   Temp 98.3 F (36.8 C) (Oral)   Ht 5' 11.5" (1.816 m)   Wt 250 lb 12 oz (113.7 kg)   BMI 34.49 kg/m   GEN: WDWN, NAD, Non-toxic, A & O x 3 HEENT: Atraumatic, Normocephalic. Neck supple. No masses, No LAD. Ears and Nose: No external deformity. CV: RRR, No M/G/R. No JVD. No thrill. No extra heart sounds. PULM: CTA B, no wheezes,  crackles, rhonchi. No retractions. No resp. distress. No accessory muscle use. EXTR: No c/c/e NEURO Normal gait.  PSYCH: Normally interactive. Conversant. Not depressed or anxious appearing.  Calm demeanor.   Laboratory and Imaging Data:  Assessment and Plan:   HYPERLIPIDEMIA  Essential hypertension   Refill meds  Follow-up: CPX  Meds ordered this encounter  Medications  . atorvastatin (LIPITOR) 40 MG tablet    Sig: Take 1 tablet (40 mg total) daily by mouth.    Dispense:  90 tablet    Refill:  3  . lisinopril (PRINIVIL,ZESTRIL) 10 MG tablet    Sig: Take 1 tablet (10 mg total) daily by mouth.    Dispense:  90 tablet    Refill:  3   Medications Discontinued During This Encounter  Medication Reason  . atorvastatin (LIPITOR) 40 MG tablet Reorder  . lisinopril (PRINIVIL,ZESTRIL) 10 MG tablet Reorder   Signed,  Karleen Hampshire T. Tahira Olivarez, MD   Allergies as of 03/15/2017      Reactions   Cefaclor    REACTION: unspecified      Medication List        Accurate as of 03/15/17  1:56 PM. Always use your most recent med list.          aspirin 81 MG tablet Take 81 mg by mouth daily.   atorvastatin 40 MG tablet Commonly known as:  LIPITOR Take 1 tablet (40 mg total) daily by mouth.   Cinnamon 500 MG capsule Take 500 mg by mouth daily.   finasteride 5 MG tablet Commonly known as:  PROSCAR Take 5 mg by mouth daily.   fish oil-omega-3 fatty acids 1000 MG capsule Take 1 g by mouth daily.   fluticasone 50 MCG/ACT nasal spray Commonly known as:  FLONASE Place 2 sprays into the nose daily as needed.   levocetirizine 5 MG tablet Commonly known as:  XYZAL Take 5 mg by mouth every evening.   lisinopril 10 MG tablet Commonly known as:  PRINIVIL,ZESTRIL Take 1 tablet (10 mg total) daily by mouth.   montelukast 10 MG tablet Commonly known as:  SINGULAIR Take 10 mg by mouth at bedtime.   multivitamin tablet Take 1 tablet by mouth daily.   omeprazole 20 MG  capsule Commonly known as:  PRILOSEC TAKE TWO CAPSULES BY MOUTH DAILY   PROAIR HFA 108 (90 Base) MCG/ACT inhaler Generic drug:  albuterol INHALE 2 PUFFS INTO THE LUNGS EVERY 6 HOURS AS NEEDED FOR WHEEZING.   sildenafil 100 MG tablet Commonly known as:  VIAGRA Take 1 tablet (100 mg total) by mouth daily as needed.   Turmeric 450 MG Caps Take 1 tablet by mouth daily.

## 2017-05-17 ENCOUNTER — Encounter: Payer: Self-pay | Admitting: Internal Medicine

## 2017-05-17 ENCOUNTER — Ambulatory Visit (INDEPENDENT_AMBULATORY_CARE_PROVIDER_SITE_OTHER): Payer: BLUE CROSS/BLUE SHIELD | Admitting: Internal Medicine

## 2017-05-17 VITALS — BP 124/78 | HR 82 | Temp 97.9°F | Wt 253.0 lb

## 2017-05-17 DIAGNOSIS — H6123 Impacted cerumen, bilateral: Secondary | ICD-10-CM

## 2017-05-17 NOTE — Progress Notes (Signed)
Subjective:    Patient ID: James Watts, male    DOB: Nov 27, 1955, 62 y.o.   MRN: [id number]  HPI  Pt presents to the clinic today with c/o a sensation of a foreign body in his ear. He reports he was trimming limbs yesterday, and feels like something went into his left ear. He denies pain or loss of hearing. He has not tried anything OTC.  Review of Systems  Past Medical History:  Diagnosis Date  . Allergic rhinitis due to pollen 03/22/2011  . GERD (gastroesophageal reflux disease) 02/25/2007   Qualifier: Diagnosis of  By: Deirdre Priest MD, Gaynell Face    . HYPERLIPIDEMIA 02/25/2007   Qualifier: Diagnosis of  By: Deirdre Priest MD, Gaynell Face    . HYPERTENSION 02/25/2007   Qualifier: Diagnosis of  By: Deirdre Priest MD, Gaynell Face      Current Outpatient Medications  Medication Sig Dispense Refill  . aspirin 81 MG tablet Take 81 mg by mouth daily.      Marland Kitchen atorvastatin (LIPITOR) 40 MG tablet Take 1 tablet (40 mg total) daily by mouth. 90 tablet 3  . Cinnamon 500 MG capsule Take 500 mg by mouth daily.    . finasteride (PROSCAR) 5 MG tablet Take 5 mg by mouth daily.    . fish oil-omega-3 fatty acids 1000 MG capsule Take 1 g by mouth daily.      . fluticasone (FLONASE) 50 MCG/ACT nasal spray Place 2 sprays into the nose daily as needed. 16 g 2  . levocetirizine (XYZAL) 5 MG tablet Take 5 mg by mouth every evening.    Marland Kitchen lisinopril (PRINIVIL,ZESTRIL) 10 MG tablet Take 1 tablet (10 mg total) daily by mouth. 90 tablet 3  . montelukast (SINGULAIR) 10 MG tablet Take 10 mg by mouth at bedtime.    . Multiple Vitamin (MULTIVITAMIN) tablet Take 1 tablet by mouth daily.    Marland Kitchen omeprazole (PRILOSEC) 20 MG capsule TAKE TWO CAPSULES BY MOUTH DAILY 180 capsule 0  . PROAIR HFA 108 (90 Base) MCG/ACT inhaler INHALE 2 PUFFS INTO THE LUNGS EVERY 6 HOURS AS NEEDED FOR WHEEZING. 8.5 g 0  . sildenafil (VIAGRA) 100 MG tablet Take 1 tablet (100 mg total) by mouth daily as needed. 30 tablet 3  . Turmeric 450 MG CAPS Take 1 tablet  by mouth daily.     No current facility-administered medications for this visit.     Allergies  Allergen Reactions  . Cefaclor     REACTION: unspecified    No family history on file.  Social History   Socioeconomic History  . Marital status: Married    Spouse name: Not on file  . Number of children: Not on file  . Years of education: Not on file  . Highest education level: Not on file  Social Needs  . Financial resource strain: Not on file  . Food insecurity - worry: Not on file  . Food insecurity - inability: Not on file  . Transportation needs - medical: Not on file  . Transportation needs - non-medical: Not on file  Occupational History  . Not on file  Tobacco Use  . Smoking status: Never Smoker  . Smokeless tobacco: Never Used  Substance and Sexual Activity  . Alcohol use: Yes    Alcohol/week: 0.0 oz    Comment: occassional  . Drug use: No  . Sexual activity: Not on file  Other Topics Concern  . Not on file  Social History Narrative  . Not on file     Constitutional:  Denies fever, malaise, fatigue, headache or abrupt weight changes.  HEENT: Pt reports foreign body in ear. Denies eye pain, eye redness, ear pain, ringing in the ears, wax buildup, runny nose, nasal congestion, bloody nose, or sore throat.  No other specific complaints in a complete review of systems (except as listed in HPI above).     Objective:   Physical Exam  BP 124/78   Pulse 82   Temp 97.9 F (36.6 C) (Oral)   Wt 253 lb (114.8 kg)   SpO2 98%   BMI 34.79 kg/m  Wt Readings from Last 3 Encounters:  05/17/17 253 lb (114.8 kg)  03/15/17 250 lb 12 oz (113.7 kg)  08/09/16 251 lb 8 oz (114.1 kg)    General: Appears his stated age,  in NAD. HEENT: Ears: bilateral cerumen impaction, no foreign body identified.   BMET    Component Value Date/Time   NA 140 11/24/2015 0938   K 4.1 11/24/2015 0938   CL 103 11/24/2015 0938   CO2 29 11/24/2015 0938   GLUCOSE 96 11/24/2015 0938    BUN 15 11/24/2015 0938   CREATININE 1.04 11/24/2015 0938   CALCIUM 9.2 11/24/2015 0938    Lipid Panel     Component Value Date/Time   CHOL 150 11/24/2015 0938   TRIG 135.0 11/24/2015 0938   HDL 37.50 (L) 11/24/2015 0938   CHOLHDL 4 11/24/2015 0938   VLDL 27.0 11/24/2015 0938   LDLCALC 86 11/24/2015 0938    CBC    Component Value Date/Time   WBC 9.1 11/24/2015 0938   RBC 5.06 11/24/2015 0938   HGB 15.5 11/24/2015 0938   HCT 44.9 11/24/2015 0938   PLT 219.0 11/24/2015 0938   MCV 88.6 11/24/2015 0938   MCHC 34.6 11/24/2015 0938   RDW 13.9 11/24/2015 0938   LYMPHSABS 2.4 11/24/2015 0938   MONOABS 0.8 11/24/2015 0938   EOSABS 0.3 11/24/2015 0938   BASOSABS 0.0 11/24/2015 0938    Hgb A1C No results found for: HGBA1C          Assessment & Plan:   Bilateral Cerumen Impaction:  He declines lavage by CMA Advised Debrox OTC- use as directed Reassured him that there was no foreign body in the ear  Return precautions discussed Nicki Reaper, NP

## 2017-05-18 ENCOUNTER — Encounter: Payer: Self-pay | Admitting: Internal Medicine

## 2017-05-18 NOTE — Patient Instructions (Signed)
Earwax Buildup, Adult The ears produce a substance called earwax that helps keep bacteria out of the ear and protects the skin in the ear canal. Occasionally, earwax can build up in the ear and cause discomfort or hearing loss. What increases the risk? This condition is more likely to develop in people who:  Are male.  Are elderly.  Naturally produce more earwax.  Clean their ears often with cotton swabs.  Use earplugs often.  Use in-ear headphones often.  Wear hearing aids.  Have narrow ear canals.  Have earwax that is overly thick or sticky.  Have eczema.  Are dehydrated.  Have excess hair in the ear canal.  What are the signs or symptoms? Symptoms of this condition include:  Reduced or muffled hearing.  A feeling of fullness in the ear or feeling that the ear is plugged.  Fluid coming from the ear.  Ear pain.  Ear itch.  Ringing in the ear.  Coughing.  An obvious piece of earwax that can be seen inside the ear canal.  How is this diagnosed? This condition may be diagnosed based on:  Your symptoms.  Your medical history.  An ear exam. During the exam, your health care provider will look into your ear with an instrument called an otoscope.  You may have tests, including a hearing test. How is this treated? This condition may be treated by:  Using ear drops to soften the earwax.  Having the earwax removed by a health care provider. The health care provider may: ? Flush the ear with water. ? Use an instrument that has a loop on the end (curette). ? Use a suction device.  Surgery to remove the wax buildup. This may be done in severe cases.  Follow these instructions at home:  Take over-the-counter and prescription medicines only as told by your health care provider.  Do not put any objects, including cotton swabs, into your ear. You can clean the opening of your ear canal with a washcloth or facial tissue.  Follow instructions from your health  care provider about cleaning your ears. Do not over-clean your ears.  Drink enough fluid to keep your urine clear or pale yellow. This will help to thin the earwax.  Keep all follow-up visits as told by your health care provider. If earwax builds up in your ears often or if you use hearing aids, consider seeing your health care provider for routine, preventive ear cleanings. Ask your health care provider how often you should schedule your cleanings.  If you have hearing aids, clean them according to instructions from the manufacturer and your health care provider. Contact a health care provider if:  You have ear pain.  You develop a fever.  You have blood, pus, or other fluid coming from your ear.  You have hearing loss.  You have ringing in your ears that does not go away.  Your symptoms do not improve with treatment.  You feel like the room is spinning (vertigo). Summary  Earwax can build up in the ear and cause discomfort or hearing loss.  The most common symptoms of this condition include reduced or muffled hearing and a feeling of fullness in the ear or feeling that the ear is plugged.  This condition may be diagnosed based on your symptoms, your medical history, and an ear exam.  This condition may be treated by using ear drops to soften the earwax or by having the earwax removed by a health care provider.  Do   not put any objects, including cotton swabs, into your ear. You can clean the opening of your ear canal with a washcloth or facial tissue. This information is not intended to replace advice given to you by your health care provider. Make sure you discuss any questions you have with your health care provider. Document Released: 06/01/2004 Document Revised: 07/05/2016 Document Reviewed: 07/05/2016 Elsevier Interactive Patient Education  2018 [employer]  

## 2017-07-21 ENCOUNTER — Other Ambulatory Visit: Payer: Self-pay | Admitting: Family Medicine

## 2017-08-19 ENCOUNTER — Other Ambulatory Visit: Payer: Self-pay | Admitting: Family Medicine

## 2017-08-23 ENCOUNTER — Other Ambulatory Visit (INDEPENDENT_AMBULATORY_CARE_PROVIDER_SITE_OTHER): Payer: BLUE CROSS/BLUE SHIELD

## 2017-08-23 ENCOUNTER — Other Ambulatory Visit: Payer: Self-pay | Admitting: Family Medicine

## 2017-08-23 DIAGNOSIS — Z0000 Encounter for general adult medical examination without abnormal findings: Secondary | ICD-10-CM

## 2017-08-23 LAB — CBC WITH DIFFERENTIAL/PLATELET
BASOS ABS: 0.1 10*3/uL (ref 0.0–0.1)
Basophils Relative: 0.8 % (ref 0.0–3.0)
EOS PCT: 2.8 % (ref 0.0–5.0)
Eosinophils Absolute: 0.2 10*3/uL (ref 0.0–0.7)
HCT: 45.2 % (ref 39.0–52.0)
HEMOGLOBIN: 15.4 g/dL (ref 13.0–17.0)
LYMPHS ABS: 2.4 10*3/uL (ref 0.7–4.0)
Lymphocytes Relative: 28.5 % (ref 12.0–46.0)
MCHC: 34 g/dL (ref 30.0–36.0)
MCV: 89.5 fl (ref 78.0–100.0)
Monocytes Absolute: 0.8 10*3/uL (ref 0.1–1.0)
Monocytes Relative: 9.2 % (ref 3.0–12.0)
NEUTROS PCT: 58.7 % (ref 43.0–77.0)
Neutro Abs: 5 10*3/uL (ref 1.4–7.7)
Platelets: 230 10*3/uL (ref 150.0–400.0)
RBC: 5.05 Mil/uL (ref 4.22–5.81)
RDW: 14.4 % (ref 11.5–15.5)
WBC: 8.5 10*3/uL (ref 4.0–10.5)

## 2017-08-23 LAB — HEPATIC FUNCTION PANEL
ALT: 23 U/L (ref 0–53)
AST: 23 U/L (ref 0–37)
Albumin: 4.2 g/dL (ref 3.5–5.2)
Alkaline Phosphatase: 72 U/L (ref 39–117)
Bilirubin, Direct: 0.1 mg/dL (ref 0.0–0.3)
TOTAL PROTEIN: 6.9 g/dL (ref 6.0–8.3)
Total Bilirubin: 0.9 mg/dL (ref 0.2–1.2)

## 2017-08-23 LAB — BASIC METABOLIC PANEL
BUN: 16 mg/dL (ref 6–23)
CALCIUM: 9.2 mg/dL (ref 8.4–10.5)
CO2: 30 meq/L (ref 19–32)
CREATININE: 1.09 mg/dL (ref 0.40–1.50)
Chloride: 102 mEq/L (ref 96–112)
GFR: 72.35 mL/min (ref >60.00)
Glucose, Bld: 99 mg/dL (ref 70–99)
Potassium: 4.3 mEq/L (ref 3.5–5.1)
Sodium: 138 mEq/L (ref 135–145)

## 2017-08-23 LAB — LIPID PANEL
Cholesterol: 156 mg/dL (ref 0–200)
HDL: 38.9 mg/dL — AB (ref >39.00)
LDL Cholesterol: 88 mg/dL (ref 0–99)
NonHDL: 117.36
TRIGLYCERIDES: 148 mg/dL (ref 0.0–149.0)
Total CHOL/HDL Ratio: 4
VLDL: 29.6 mg/dL (ref 0.0–40.0)

## 2017-08-23 LAB — PSA: PSA: 3.02 ng/mL (ref 0.10–4.00)

## 2017-08-29 ENCOUNTER — Other Ambulatory Visit: Payer: Self-pay

## 2017-08-29 ENCOUNTER — Ambulatory Visit (INDEPENDENT_AMBULATORY_CARE_PROVIDER_SITE_OTHER): Payer: BLUE CROSS/BLUE SHIELD | Admitting: Family Medicine

## 2017-08-29 ENCOUNTER — Encounter: Payer: Self-pay | Admitting: Family Medicine

## 2017-08-29 VITALS — BP 110/60 | HR 65 | Temp 98.3°F | Ht 71.5 in | Wt 252.8 lb

## 2017-08-29 DIAGNOSIS — Z0000 Encounter for general adult medical examination without abnormal findings: Secondary | ICD-10-CM | POA: Diagnosis not present

## 2017-08-29 NOTE — Progress Notes (Signed)
Dr. Frederico Hamman T. Mattalyn Anderegg, MD, [facility] [place], [address] Phone: [phone] Fax: [phone]  08/29/2017  Patient: James Watts, MRN: [id number], DOB: [birth year], 62 y.o.  Primary Physician:  Owens Loffler, MD   Chief Complaint  Patient presents with  . Annual Exam   Subjective:   James Watts is a 62 y.o. pleasant patient who presents with the following:  Preventative Health Maintenance Visit:  Health Maintenance Summary Reviewed and updated, unless pt declines services.  Tobacco History Reviewed. Alcohol: No concerns, no excessive use Exercise Habits: Some activity, rec at least 30 mins 5 times a week STD concerns: no risk or activity to increase risk Drug Use: None Encouraged self-testicular check  Moved to [facility]  Topic Date Due  . INFLUENZA VACCINE  12/06/2017  . TETANUS/TDAP  07/12/2022  . COLONOSCOPY  03/07/2027  . Hepatitis C Screening  Completed  . HIV Screening  Completed   Immunization History  Administered Date(s) Administered  . Influenza Split 03/22/2011, 02/13/2012  . Influenza Whole 02/25/2007, 03/03/2008, 03/22/2009, 04/06/2010  . Influenza,inj,Quad PF,6+ Mos 02/27/2013, 03/06/2014, 02/10/2015, 02/25/2016, 02/21/2017  . Td 03/22/2009  . Tdap 07/11/2012  . Zoster 03/17/2015   Patient Active Problem List   Diagnosis Date Noted  . Post-traumatic arthritis of ankle 08/13/2014  . Allergic rhinitis due to pollen 03/22/2011  . HYPERLIPIDEMIA 02/25/2007  . Essential hypertension 02/25/2007  . GERD (gastroesophageal reflux disease) 02/25/2007   Past Medical History:  Diagnosis Date  . Allergic rhinitis due to pollen 03/22/2011  . GERD (gastroesophageal reflux disease) 02/25/2007   Qualifier: Diagnosis of  By: Erin Hearing MD, Ruthann Cancer    . HYPERLIPIDEMIA 02/25/2007   Qualifier: Diagnosis of  By: Erin Hearing MD, Ruthann Cancer    . HYPERTENSION 02/25/2007   Qualifier: Diagnosis of  By: Erin Hearing MD, Ruthann Cancer     Past Surgical History:  Procedure Laterality Date  . TONSILLECTOMY     Social History   Socioeconomic History  . Marital status: Married    Spouse name: Not on file  . Number of children: Not on file  . Years of education: Not on file  . Highest education level: Not on file  Occupational History  . Not on file  Social Needs  . Financial resource strain: Not on file  . Food insecurity:    Worry: Not on file    Inability: Not on file  . Transportation needs:    Medical: Not on file    Non-medical: Not on file  Tobacco Use  . Smoking status: Never Smoker  . Smokeless tobacco: Never Used  Substance and Sexual Activity  . Alcohol use: Yes    Alcohol/week: 0.0 oz    Comment: occassional  . Drug use: No  . Sexual activity: Not on file  Lifestyle  . Physical activity:    Days per week: Not on file    Minutes per session: Not on file  . Stress: Not on file  Relationships  . Social connections:    Talks on phone: Not on file    Gets together: Not on file    Attends religious service: Not on file    Active member of club or organization: Not on file    Attends meetings of clubs or organizations: Not on file    Relationship status: Not on file  . Intimate partner violence:    Fear of current or ex partner: Not on file    Emotionally abused:  Not on file    Physically abused: Not on file    Forced sexual activity: Not on file  Other Topics Concern  . Not on file  Social History Narrative  . Not on file   History reviewed. No pertinent family history. Allergies  Allergen Reactions  . Cefaclor     REACTION: unspecified    Medication list has been reviewed and updated.   General: Denies fever, chills, sweats. No significant weight loss. Eyes: Denies blurring,significant itching ENT: Denies earache, sore throat, and hoarseness. Cardiovascular: Denies chest pains, palpitations, dyspnea on exertion Respiratory:  Denies cough, dyspnea at rest,wheeezing Breast: no concerns about lumps GI: Denies nausea, vomiting, diarrhea, constipation, change in bowel habits, abdominal pain, melena, hematochezia GU: Denies penile discharge, ED, urinary flow / outflow problems. No STD concerns. Musculoskeletal: Denies back pain, joint pain Derm: Denies rash, itching Neuro: Denies  paresthesias, frequent falls, frequent headaches Psych: Denies depression, anxiety Endocrine: Denies cold intolerance, heat intolerance, polydipsia Heme: Denies enlarged lymph nodes Allergy: No hayfever  Objective:   BP 110/60   Pulse 65   Temp 98.3 F (36.8 C) (Oral)   Ht 5' 11.5" (1.816 m)   Wt 252 lb 12 oz (114.6 kg)   BMI 34.76 kg/m  Ideal Body Weight: Weight in (lb) to have BMI = 25: 181.4  No exam data present  GEN: well developed, well nourished, no acute distress Eyes: conjunctiva and lids normal, PERRLA, EOMI ENT: TM clear, nares clear, oral exam WNL Neck: supple, no lymphadenopathy, no thyromegaly, no JVD Pulm: clear to auscultation and percussion, respiratory effort normal CV: regular rate and rhythm, S1-S2, no murmur, rub or gallop, no bruits, peripheral pulses normal and symmetric, no cyanosis, clubbing, edema or varicosities GI: soft, non-tender; no hepatosplenomegaly, masses; active bowel sounds all quadrants GU: no hernia, testicular mass, penile discharge Lymph: no cervical, axillary or inguinal adenopathy MSK: gait normal, muscle tone and strength WNL, no joint swelling, effusions, discoloration, crepitus  SKIN: clear, good turgor, color WNL, no rashes, lesions, or ulcerations Neuro: normal mental status, normal strength, sensation, and motion Psych: alert; oriented to person, place and time, normally interactive and not anxious or depressed in appearance. All labs reviewed with patient.  Lipids:    Component Value Date/Time   CHOL 156 08/23/2017 0934   TRIG 148.0 08/23/2017 0934   HDL 38.90 (L)  08/23/2017 0934   LDLDIRECT 112.5 03/22/2011 1451   VLDL 29.6 08/23/2017 0934   CHOLHDL 4 08/23/2017 0934   CBC: CBC Latest Ref Rng & Units 08/23/2017 11/24/2015 08/19/2014  WBC 4.0 - 10.5 K/uL 8.5 9.1 8.3  Hemoglobin 13.0 - 17.0 g/dL 15.4 15.5 15.2  Hematocrit 39.0 - 52.0 % 45.2 44.9 44.5  Platelets 150.0 - 400.0 K/uL 230.0 219.0 811.9    Basic Metabolic Panel:    Component Value Date/Time   NA 138 08/23/2017 0934   K 4.3 08/23/2017 0934   CL 102 08/23/2017 0934   CO2 30 08/23/2017 0934   BUN 16 08/23/2017 0934   CREATININE 1.09 08/23/2017 0934   GLUCOSE 99 08/23/2017 0934   CALCIUM 9.2 08/23/2017 0934   Hepatic Function Latest Ref Rng & Units 08/23/2017 11/24/2015 08/19/2014  Total Protein 6.0 - 8.3 g/dL 6.9 6.8 6.9  Albumin 3.5 - 5.2 g/dL 4.2 4.1 4.1  AST 0 - 37 U/L _0 ALT 0 - 53 U/L _1 Alk Phosphatase 39 - 117 U/L 72 81 74  Total Bilirubin 0.2 - 1.2 mg/dL 0.9  0.7 0.7  Bilirubin, Direct 0.0 - 0.3 mg/dL 0.1 0.1 0.1    No results found for: TSH Lab Results  Component Value Date   PSA 3.02 08/23/2017   PSA 1.39 11/24/2015   PSA 0.72 08/19/2014    Assessment and Plan:   Routine general medical examination at a health care facility  Health Maintenance Exam: The patient's preventative maintenance and recommended screening tests for an annual wellness exam were reviewed in full today. Brought up to date unless services declined.  Counselled on the importance of diet, exercise, and its role in overall health and mortality. The patient's FH and SH was reviewed, including their home life, tobacco status, and drug and alcohol status.  Follow-up in 1 year for physical exam or additional follow-up below.  Follow-up: No follow-ups on file. Or follow-up in 1 year if not noted.  Signed,  Maud Deed. Daisha Filosa, MD   Allergies as of 08/29/2017      Reactions   Cefaclor    REACTION: unspecified      Medication List        Accurate as of 08/29/17  4:10 PM.  Always use your most recent med list.          aspirin 81 MG tablet Take 81 mg by mouth daily.   atorvastatin 40 MG tablet Commonly known as:  LIPITOR Take 1 tablet (40 mg total) daily by mouth.   Cinnamon 500 MG capsule Take 500 mg by mouth daily.   fish oil-omega-3 fatty acids 1000 MG capsule Take 1 g by mouth daily.   fluticasone 50 MCG/ACT nasal spray Commonly known as:  FLONASE Place 2 sprays into the nose daily as needed.   levocetirizine 5 MG tablet Commonly known as:  XYZAL Take 5 mg by mouth every evening.   lisinopril 10 MG tablet Commonly known as:  PRINIVIL,ZESTRIL Take 1 tablet (10 mg total) daily by mouth.   montelukast 10 MG tablet Commonly known as:  SINGULAIR Take 10 mg by mouth at bedtime.   multivitamin tablet Take 1 tablet by mouth daily.   omeprazole 20 MG capsule Commonly known as:  PRILOSEC TAKE 2 CAPSULES BY MOUTH EVERY DAY   PROAIR HFA 108 (90 Base) MCG/ACT inhaler Generic drug:  albuterol INHALE 2 PUFFS INTO THE LUNGS EVERY 6 HOURS AS NEEDED FOR WHEEZING.   sildenafil 100 MG tablet Commonly known as:  VIAGRA Take 1 tablet (100 mg total) by mouth daily as needed.   Turmeric 450 MG Caps Take 1 tablet by mouth daily.

## 2017-08-31 ENCOUNTER — Other Ambulatory Visit: Payer: Self-pay | Admitting: Family Medicine

## 2017-08-31 MED ORDER — OMEPRAZOLE 20 MG PO CPDR: 40.0000 mg | DELAYED_RELEASE_CAPSULE | Freq: Every day | ORAL | 3 refills | Status: DC

## 2017-08-31 MED ORDER — ATORVASTATIN CALCIUM 40 MG PO TABS: 40.0000 mg | ORAL_TABLET | Freq: Every day | ORAL | 3 refills | Status: DC

## 2017-08-31 NOTE — Telephone Encounter (Signed)
Copied from CRM [id number]. Topic: Quick Communication - Rx Refill/Question >> Aug 31, 2017 10:02 AM Oneal Grout wrote: Medication: atorvastatin (LIPITOR) 40 MG tablet, omeprazole (PRILOSEC) 20 MG capsule requesting 90 supply Has the patient contacted their pharmacy? No. New pharmacy (Agent: If no, request that the patient contact the pharmacy for the refill.) Preferred Pharmacy (with phone number or street name): [facility] in [facility] [address] Agent: Please be advised that RX refills may take up to 3 business days. We ask that you follow-up with your pharmacy.

## 2017-08-31 NOTE — Telephone Encounter (Signed)
Pt seen 08/29/17 for annual with no med changes. Refilled per protocol and pt notified vial v/m per DPR.

## 2017-10-29 ENCOUNTER — Telehealth: Payer: Self-pay | Admitting: Family Medicine

## 2017-10-29 NOTE — Telephone Encounter (Signed)
Copied from CRM [id number]. Topic: Quick Communication - Rx Refill/Question >> Oct 29, 2017  4:53 PM Alexander Bergeron B wrote: Medication: lisinopril (PRINIVIL,ZESTRIL) 10 MG tablet [[id number]]   Has the patient contacted their pharmacy? Yes.   (Agent: If no, request that the patient contact the pharmacy for the refill.) (Agent: If yes, when and what did the pharmacy advise?)  Preferred Pharmacy (with phone number or street name): [facility]  Agent: Please be advised that RX refills may take up to 3 business days. We ask that you follow-up with your pharmacy.

## 2017-10-30 MED ORDER — LISINOPRIL 10 MG PO TABS: 10.0000 mg | ORAL_TABLET | Freq: Every day | ORAL | 3 refills | Status: DC

## 2018-01-09 ENCOUNTER — Ambulatory Visit: Payer: BLUE CROSS/BLUE SHIELD | Admitting: Family Medicine

## 2018-03-29 ENCOUNTER — Ambulatory Visit (INDEPENDENT_AMBULATORY_CARE_PROVIDER_SITE_OTHER): Payer: BLUE CROSS/BLUE SHIELD | Admitting: Family Medicine

## 2018-03-29 ENCOUNTER — Encounter: Payer: Self-pay | Admitting: Family Medicine

## 2018-03-29 ENCOUNTER — Ambulatory Visit (INDEPENDENT_AMBULATORY_CARE_PROVIDER_SITE_OTHER)
Admission: RE | Admit: 2018-03-29 | Discharge: 2018-03-29 | Disposition: A | Discharge status: Home / Self Care | Payer: BLUE CROSS/BLUE SHIELD | Source: Ambulatory Visit | Attending: Family Medicine | Admitting: Family Medicine

## 2018-03-29 ENCOUNTER — Other Ambulatory Visit: Payer: Self-pay

## 2018-03-29 VITALS — BP 120/76 | HR 74 | Temp 98.4°F | Ht 71.5 in | Wt 250.8 lb

## 2018-03-29 DIAGNOSIS — J4521 Mild intermittent asthma with (acute) exacerbation: Secondary | ICD-10-CM

## 2018-03-29 DIAGNOSIS — J209 Acute bronchitis, unspecified: Secondary | ICD-10-CM

## 2018-03-29 MED ORDER — ALBUTEROL SULFATE (2.5 MG/3ML) 0.083% IN NEBU
2.5000 mg | INHALATION_SOLUTION | Freq: Once | RESPIRATORY_TRACT | Status: AC
  Administered 2018-03-29: 2.5 mg via RESPIRATORY_TRACT

## 2018-03-29 MED ORDER — PREDNISONE 20 MG PO TABS
ORAL_TABLET | ORAL | 0 refills | Status: DC
Start: 2018-03-29 — End: 2018-12-25

## 2018-03-29 MED ORDER — AZITHROMYCIN 250 MG PO TABS: ORAL_TABLET | ORAL | 0 refills | Status: DC

## 2018-03-29 NOTE — Assessment & Plan Note (Signed)
In h/o asthma, current exacerbation. Treat with albuterol nebulization in office, then rec prednisone taper and scheduled albuterol over next 3 days. Update if not improving with treatment.

## 2018-03-29 NOTE — Progress Notes (Signed)
BP 120/76 (BP Location: Left Arm, Patient Position: Sitting, Cuff Size: Large)   Pulse 74   Temp 98.4 F (36.9 C) (Oral)   Ht 5' 11.5" (1.816 m)   Wt 250 lb 12 oz (113.7 kg)   SpO2 95%   BMI 34.49 kg/m    CC: cough Subjective:    Patient ID: James Watts, male    DOB: [birth year], 62 y.o.   MRN: [id number]  HPI: James Watts is a 62 y.o. male presenting on 03/29/2018 for Cough (C/o dry cough for about 2 wks. Recently traveled out of country [employer]]. Tried inhaler and Zicam, barely helpful.)   Recent 30d cruise to [place]. Just returned today.  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  2 wk h/o nonproductive deep cough, chest congestion. Cough keeps him up at night.   No fevers/chills, ear or tooth pain, ST, PNdrainage, dyspnea or wheezing. No head congestion.  Treating with nyquil, zycam and albuterol inhaler (2 puffs bid) without benefit.  H/o asthma, on albuterol, singulair, flonase and xyzal regularly. Non smoker.   Relevant past medical, surgical, family and social history reviewed and updated as indicated. Interim medical history since our last visit reviewed. Allergies and medications reviewed and updated. Outpatient Medications Prior to Visit  Medication Sig Dispense Refill  . aspirin 81 MG tablet Take 81 mg by mouth daily.      Marland Kitchen atorvastatin (LIPITOR) 40 MG tablet Take 1 tablet (40 mg total) by mouth daily. 90 tablet 3  . Cinnamon 500 MG capsule Take 500 mg by mouth daily.    . fish oil-omega-3 fatty acids 1000 MG capsule Take 1 g by mouth daily.      . fluticasone (FLONASE) 50 MCG/ACT nasal spray Place 2 sprays into the nose daily as needed. 16 g 2  . levocetirizine (XYZAL) 5 MG tablet Take 5 mg by mouth every evening.    Marland Kitchen lisinopril (PRINIVIL,ZESTRIL) 10 MG tablet Take 1 tablet (10 mg total) by mouth daily. 90 tablet 3  . montelukast (SINGULAIR) 10 MG tablet Take 10 mg by mouth at bedtime.    . Multiple Vitamin (MULTIVITAMIN) tablet Take 1 tablet by  mouth daily.    Marland Kitchen omeprazole (PRILOSEC) 20 MG capsule Take 2 capsules (40 mg total) by mouth daily. 180 capsule 3  . PROAIR HFA 108 (90 Base) MCG/ACT inhaler INHALE 2 PUFFS INTO THE LUNGS EVERY 6 HOURS AS NEEDED FOR WHEEZING. 8.5 g 0  . Turmeric 450 MG CAPS Take 1 tablet by mouth daily.    . sildenafil (VIAGRA) 100 MG tablet Take 1 tablet (100 mg total) by mouth daily as needed. 30 tablet 3   No facility-administered medications prior to visit.      Per HPI unless specifically indicated in ROS section below Review of Systems     Objective:    BP 120/76 (BP Location: Left Arm, Patient Position: Sitting, Cuff Size: Large)   Pulse 74   Temp 98.4 F (36.9 C) (Oral)   Ht 5' 11.5" (1.816 m)   Wt 250 lb 12 oz (113.7 kg)   SpO2 95%   BMI 34.49 kg/m   Wt Readings from Last 3 Encounters:  03/29/18 250 lb 12 oz (113.7 kg)  08/29/17 252 lb 12 oz (114.6 kg)  05/17/17 253 lb (114.8 kg)    Physical Exam  Constitutional: He appears well-developed and well-nourished. No distress.  HENT:  Head: Normocephalic and atraumatic.  Right Ear: Hearing, tympanic membrane, external ear and ear canal  normal.  Left Ear: Hearing, tympanic membrane, external ear and ear canal normal.  Nose: Nose normal. No mucosal edema or rhinorrhea. Right sinus exhibits no maxillary sinus tenderness and no frontal sinus tenderness. Left sinus exhibits no maxillary sinus tenderness and no frontal sinus tenderness.  Mouth/Throat: Uvula is midline, oropharynx is clear and moist and mucous membranes are normal. No oropharyngeal exudate, posterior oropharyngeal edema, posterior oropharyngeal erythema or tonsillar abscesses.  Eyes: Pupils are equal, round, and reactive to light. Conjunctivae and EOM are normal. No scleral icterus.  Neck: Normal range of motion. Neck supple.  Cardiovascular: Normal rate, regular rhythm, normal heart sounds and intact distal pulses.  No murmur heard. Pulmonary/Chest: Effort normal. No respiratory  distress. He has decreased breath sounds. He has wheezes (diffuse polysymphonic). He has rhonchi (coarse). He has rales (coarse bilaterally).  Lymphadenopathy:    He has no cervical adenopathy.  Skin: Skin is warm and dry. No rash noted.  Nursing note and vitals reviewed.     Assessment & Plan:   Problem List Items Addressed This Visit    RAD (reactive airway disease) with wheezing, mild intermittent, with acute exacerbation - Primary    In h/o asthma, current exacerbation. Treat with albuterol nebulization in office, then rec prednisone taper and scheduled albuterol over next 3 days. Update if not improving with treatment.       Relevant Medications   albuterol (PROVENTIL) (2.5 MG/3ML) 0.083% nebulizer solution 2.5 mg (Completed) (Start on 03/29/2018 12:00 PM)   predniSONE (DELTASONE) 20 MG tablet   Other Relevant Orders   DG Chest 2 View   Acute bronchitis    Cover for atypical infection after recent cruise and with prolonged symptoms with zpack antibiotic. Update if not improving with treatment. CXR seems clear.       Relevant Orders   DG Chest 2 View       Meds ordered this encounter  Medications  . albuterol (PROVENTIL) (2.5 MG/3ML) 0.083% nebulizer solution 2.5 mg  . predniSONE (DELTASONE) 20 MG tablet    Sig: Take two tablets daily for 3 days followed by one tablet daily for 4 days    Dispense:  10 tablet    Refill:  0  . azithromycin (ZITHROMAX) 250 MG tablet    Sig: Take two tablets on day one followed by one tablet on days 2-5    Dispense:  6 each    Refill:  0   Orders Placed This Encounter  Procedures  . DG Chest 2 View    Standing Status:   Future    Number of Occurrences:   1    Standing Expiration Date:   05/30/2019    Order Specific Question:   Reason for Exam (SYMPTOM  OR DIAGNOSIS REQUIRED)    Answer:   bronchitis, asthma exacerbation r/o PNA after cruise    Order Specific Question:   Preferred imaging location?    Answer:   [facility]     Order Specific Question:   Radiology Contrast Protocol - do NOT remove file path    Answer:   [email or web address]    Follow up plan: Return if symptoms worsen or fail to improve.  Eustaquio Boyden, MD

## 2018-03-29 NOTE — Assessment & Plan Note (Addendum)
Cover for atypical infection after recent cruise and with prolonged symptoms with zpack antibiotic. Update if not improving with treatment. CXR seems clear.

## 2018-03-29 NOTE — Patient Instructions (Signed)
I think you have asthma flare in setting of acute bronchitis - treat with albuterol nebulization in office, then start zpack antibiotic, prednisone taper, and scheduled albuterol 2 puffs three times a day for next 3 days, decrease as able.  Chest xray today.  Let us know if fever >101, worsening productive cough, or not improving as expected.

## 2018-04-08 ENCOUNTER — Other Ambulatory Visit: Payer: Self-pay | Admitting: *Deleted

## 2018-04-08 NOTE — Telephone Encounter (Signed)
Pt requesting medication refill. Last Rx 01/19/2017, Last OV 03/29/2018

## 2018-04-10 MED ORDER — ALBUTEROL SULFATE HFA 108 (90 BASE) MCG/ACT IN AERS: INHALATION_SPRAY | RESPIRATORY_TRACT | 1 refills | Status: DC

## 2018-05-04 IMAGING — DX DG SHOULDER 2+V*L*
3 series · 3 of 3 positions shown · non-contrast
Comparison: None.

CLINICAL DATA: Left shoulder pain for 1 month.  Repetitive motion.

EXAM:
LEFT SHOULDER - 2+ VIEW

[shoulder axial]
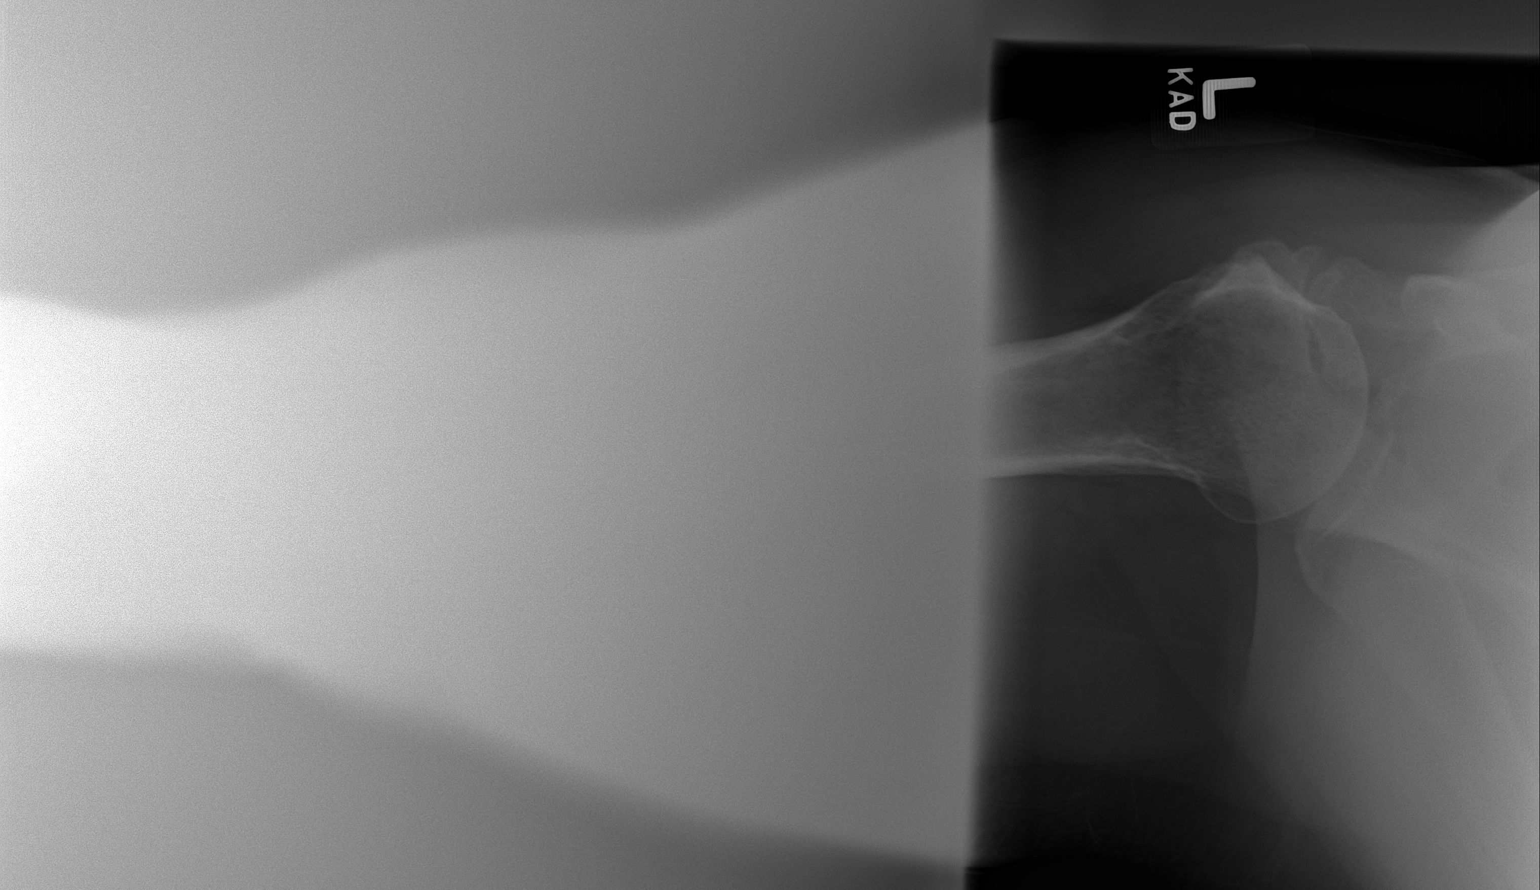

[shoulder ap]
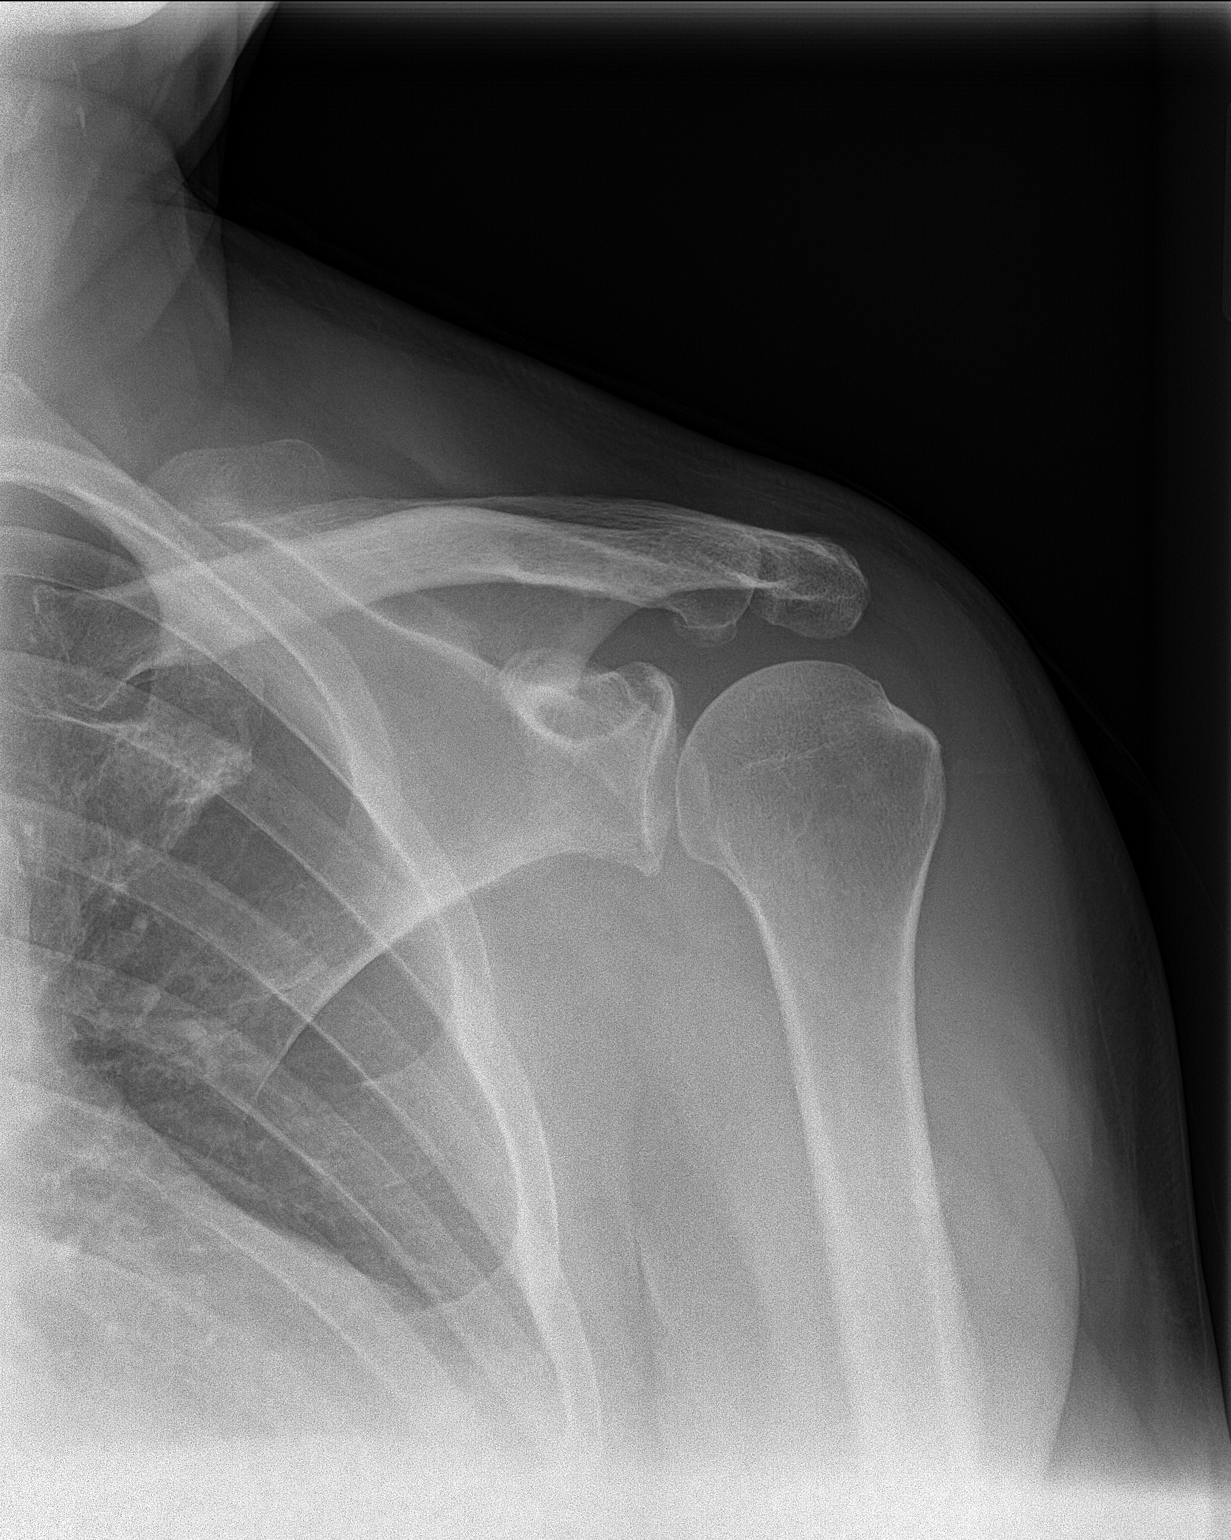

[shoulder y-view]
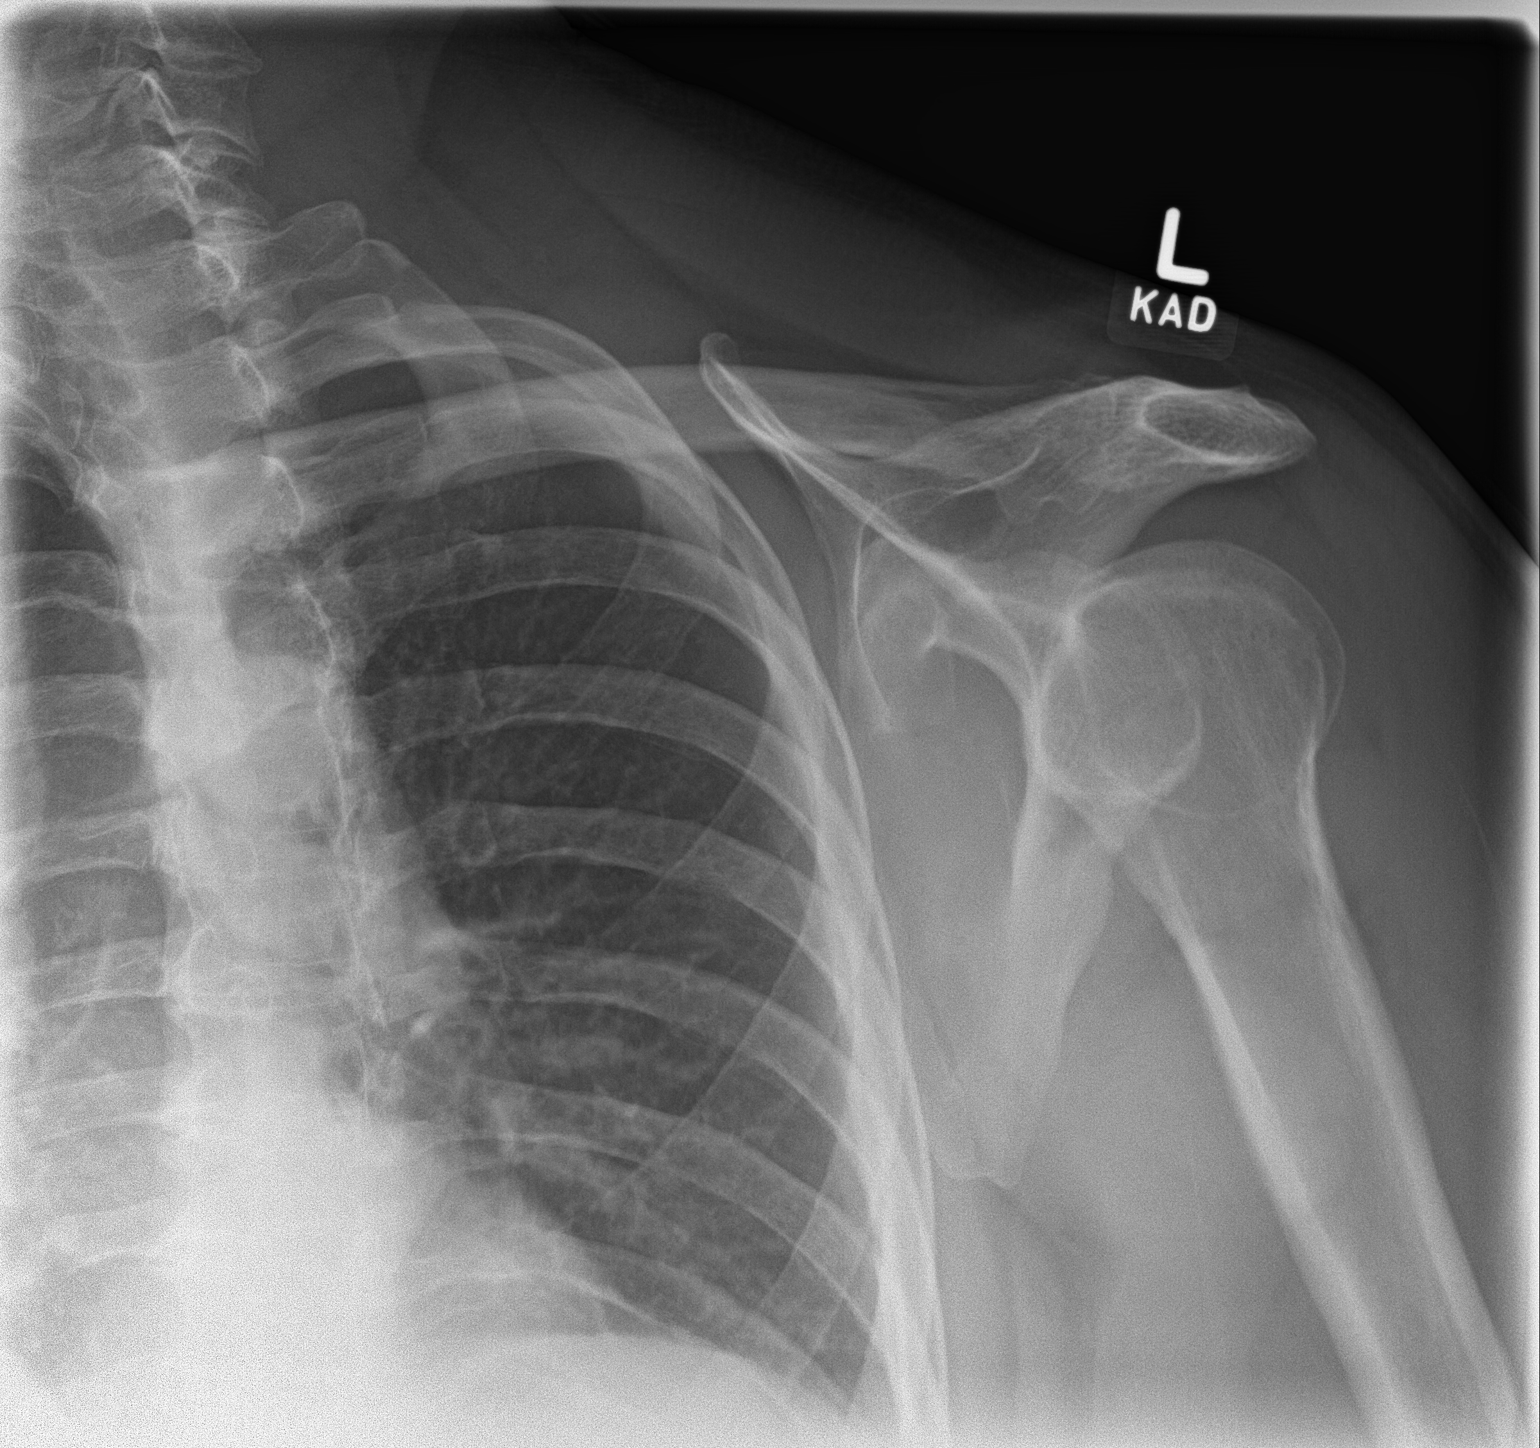

[3 of 3 positions shown; findings below may reference images not displayed]

FINDINGS: No fracture. No left glenohumeral joint dislocation. No suspicious
focal osseous lesion. No evidence of left acromioclavicular
separation. Small spur at the inferior lateral left clavicular head.
No appreciable degenerative or erosive arthropathy in the left
acromioclavicular or left glenohumeral joints. No pathologic soft
tissue calcifications.
IMPRESSION: No fracture or malalignment.

Small spur at the inferior lateral left clavicular head.

## 2018-05-16 ENCOUNTER — Other Ambulatory Visit: Payer: Self-pay | Admitting: Family Medicine

## 2018-07-18 ENCOUNTER — Other Ambulatory Visit: Payer: Self-pay | Admitting: Family Medicine

## 2018-10-09 ENCOUNTER — Other Ambulatory Visit: Payer: Self-pay | Admitting: Family Medicine

## 2018-10-09 NOTE — Telephone Encounter (Signed)
Left message asking pt to call office  Start looking for cpx around 8/13 or later

## 2018-10-09 NOTE — Telephone Encounter (Signed)
Please schedule CPE with fasting labs prior with Dr. Copland.  

## 2018-10-10 NOTE — Telephone Encounter (Signed)
Patient returned call and CPE/ fasting Labs have been scheduled

## 2018-12-02 ENCOUNTER — Telehealth: Payer: Self-pay

## 2018-12-02 NOTE — Telephone Encounter (Signed)
Left message asking pt to call office  °

## 2018-12-02 NOTE — Telephone Encounter (Signed)
Lab appointment r/s to 8/11

## 2018-12-02 NOTE — Telephone Encounter (Signed)
[place] Night - Client Nonclinical Telephone Record AccessNurse Client [facility] Night - Client Client Site [place] Physician Owens Loffler - MD Contact Type Call Who Is Calling Patient / Member / Family / Caregiver Caller Name Kennan Phone Number [phone] Patient Name James Watts Patient DOB Apr 09, 2056 Call Type Message Only Information Provided Reason for Call Request to Reschedule Office Appointment Initial Comment Caller asking if he can get his appt for blood work moved up Additional Comment Call Closed By: Jearld Shines Transaction Date/Time: 11/30/2018 11:31:22 AM (ET)

## 2018-12-10 ENCOUNTER — Other Ambulatory Visit: Payer: Self-pay | Admitting: Family Medicine

## 2018-12-10 DIAGNOSIS — Z131 Encounter for screening for diabetes mellitus: Secondary | ICD-10-CM

## 2018-12-10 DIAGNOSIS — Z0000 Encounter for general adult medical examination without abnormal findings: Secondary | ICD-10-CM

## 2018-12-17 ENCOUNTER — Other Ambulatory Visit: Payer: Self-pay

## 2018-12-17 ENCOUNTER — Other Ambulatory Visit (INDEPENDENT_AMBULATORY_CARE_PROVIDER_SITE_OTHER): Payer: Medicare Other

## 2018-12-17 DIAGNOSIS — Z131 Encounter for screening for diabetes mellitus: Secondary | ICD-10-CM

## 2018-12-17 DIAGNOSIS — Z0000 Encounter for general adult medical examination without abnormal findings: Secondary | ICD-10-CM | POA: Diagnosis not present

## 2018-12-17 LAB — LIPID PANEL
Cholesterol: 170 mg/dL (ref 0–200)
HDL: 41.2 mg/dL (ref >39.00)
LDL Cholesterol: 98 mg/dL (ref 0–99)
NonHDL: 129.19
Total CHOL/HDL Ratio: 4
Triglycerides: 154 mg/dL — ABNORMAL HIGH (ref 0.0–149.0)
VLDL: 30.8 mg/dL (ref 0.0–40.0)

## 2018-12-17 LAB — HEPATIC FUNCTION PANEL
ALT: 28 U/L (ref 0–53)
AST: 25 U/L (ref 0–37)
Albumin: 4.6 g/dL (ref 3.5–5.2)
Alkaline Phosphatase: 86 U/L (ref 39–117)
Bilirubin, Direct: 0.1 mg/dL (ref 0.0–0.3)
Total Bilirubin: 0.7 mg/dL (ref 0.2–1.2)
Total Protein: 7.3 g/dL (ref 6.0–8.3)

## 2018-12-17 LAB — BASIC METABOLIC PANEL
BUN: 19 mg/dL (ref 6–23)
CO2: 33 mEq/L — ABNORMAL HIGH (ref 19–32)
Calcium: 9.7 mg/dL (ref 8.4–10.5)
Chloride: 101 mEq/L (ref 96–112)
Creatinine, Ser: 1.08 mg/dL (ref 0.40–1.50)
GFR: 68.52 mL/min (ref >60.00)
Glucose, Bld: 110 mg/dL — ABNORMAL HIGH (ref 70–99)
Potassium: 4.5 mEq/L (ref 3.5–5.1)
Sodium: 138 mEq/L (ref 135–145)

## 2018-12-17 LAB — CBC WITH DIFFERENTIAL/PLATELET
Basophils Absolute: 0 10*3/uL (ref 0.0–0.1)
Basophils Relative: 0.5 % (ref 0.0–3.0)
Eosinophils Absolute: 0.4 10*3/uL (ref 0.0–0.7)
Eosinophils Relative: 4.3 % (ref 0.0–5.0)
HCT: 47.1 % (ref 39.0–52.0)
Hemoglobin: 15.9 g/dL (ref 13.0–17.0)
Lymphocytes Relative: 28.4 % (ref 12.0–46.0)
Lymphs Abs: 2.4 10*3/uL (ref 0.7–4.0)
MCHC: 33.8 g/dL (ref 30.0–36.0)
MCV: 90.8 fl (ref 78.0–100.0)
Monocytes Absolute: 0.8 10*3/uL (ref 0.1–1.0)
Monocytes Relative: 9.6 % (ref 3.0–12.0)
Neutro Abs: 4.8 10*3/uL (ref 1.4–7.7)
Neutrophils Relative %: 57.2 % (ref 43.0–77.0)
Platelets: 218 10*3/uL (ref 150.0–400.0)
RBC: 5.19 Mil/uL (ref 4.22–5.81)
RDW: 14.1 % (ref 11.5–15.5)
WBC: 8.4 10*3/uL (ref 4.0–10.5)

## 2018-12-17 LAB — HEMOGLOBIN A1C: Hgb A1c MFr Bld: 6.1 % (ref 4.6–6.5)

## 2018-12-18 ENCOUNTER — Other Ambulatory Visit: Payer: BLUE CROSS/BLUE SHIELD

## 2018-12-18 LAB — PSA, TOTAL WITH REFLEX TO PSA, FREE: PSA, Total: 3.2 ng/mL (ref <=4.0)

## 2018-12-25 ENCOUNTER — Encounter: Payer: Self-pay | Admitting: Family Medicine

## 2018-12-25 ENCOUNTER — Other Ambulatory Visit: Payer: Self-pay

## 2018-12-25 ENCOUNTER — Ambulatory Visit (INDEPENDENT_AMBULATORY_CARE_PROVIDER_SITE_OTHER): Payer: Medicare Other | Admitting: Family Medicine

## 2018-12-25 VITALS — BP 118/76 | HR 62 | Temp 97.9°F | Ht 71.5 in | Wt 250.0 lb

## 2018-12-25 DIAGNOSIS — Z0000 Encounter for general adult medical examination without abnormal findings: Secondary | ICD-10-CM | POA: Diagnosis not present

## 2018-12-25 MED ORDER — LOSARTAN POTASSIUM 50 MG PO TABS: 50.0000 mg | ORAL_TABLET | Freq: Every day | ORAL | 3 refills | Status: DC

## 2018-12-25 NOTE — Progress Notes (Signed)
James Mandler T. Beula Joyner, MD Primary Care and Sports Medicine [facility] [address] [place], [address] Phone: [phone]  FAX: [phone]  James Watts - 63 y.o. male  MRN [id number]  Date of Birth: 05-12-1955  Visit Date: 12/25/2018  PCP: Hannah Beat, MD  Referred by: Hannah Beat, MD  Chief Complaint  Patient presents with  . Medicare Wellness   Patient Care Team: Hannah Beat, MD as PCP - General (Family Medicine) Subjective:   James Watts is a 63 y.o. pleasant patient who presents for a Welcome to Medicare examination:  Preventative Health Maintenance Visit:  Health Maintenance Summary Reviewed and updated, unless pt declines services.  Tobacco History Reviewed. Alcohol: No concerns, no excessive use Exercise Habits: Some activity, rec at least 30 mins 5 times a week STD concerns: no risk or activity to increase risk Drug Use: None Encouraged self-testicular check  PNA vacc - done at pharmacy  Allergy tests last week. Some allergy meds.   D/C ACE and to ARB, ? Ace cough  Prevnar-13, will call and check with [facility] Will get Flu vaccine Got the shingrix at [facility] Readings from Last 3 Encounters:  12/25/18 250 lb (113.4 kg)  03/29/18 250 lb 12 oz (113.7 kg)  08/29/17 252 lb 12 oz (114.6 kg)     Health Maintenance  Topic Date Due  . PNA vac Low Risk Adult (1 of 2 - PCV13) 06/22/2018  . INFLUENZA VACCINE  12/07/2018  . TETANUS/TDAP  07/12/2022  . COLONOSCOPY  03/07/2027  . Hepatitis C Screening  Completed  . HIV Screening  Completed    Immunization History  Administered Date(s) Administered  . Influenza Split 03/22/2011, 02/13/2012  . Influenza Whole 02/25/2007, 03/03/2008, 03/22/2009, 04/06/2010  . Influenza,inj,Quad PF,6+ Mos 02/27/2013, 03/06/2014, 02/10/2015, 02/25/2016, 02/21/2017, 01/29/2018  . Td 03/22/2009  . Tdap 07/11/2012  . Zoster 03/17/2015    Patient Active  Problem List   Diagnosis Date Noted  . RAD (reactive airway disease) with wheezing, mild intermittent, with acute exacerbation 03/29/2018  . Post-traumatic arthritis of ankle 08/13/2014  . Allergic rhinitis due to pollen 03/22/2011  . HYPERLIPIDEMIA 02/25/2007  . Essential hypertension 02/25/2007  . GERD (gastroesophageal reflux disease) 02/25/2007   Past Medical History:  Diagnosis Date  . Allergic rhinitis due to pollen 03/22/2011  . GERD (gastroesophageal reflux disease) 02/25/2007   Qualifier: Diagnosis of  By: Deirdre Priest MD, Gaynell Face    . HYPERLIPIDEMIA 02/25/2007   Qualifier: Diagnosis of  By: Deirdre Priest MD, Gaynell Face    . HYPERTENSION 02/25/2007   Qualifier: Diagnosis of  By: Deirdre Priest MD, Gaynell Face     Past Surgical History:  Procedure Laterality Date  . TONSILLECTOMY     Social History   Socioeconomic History  . Marital status: Married    Spouse name: Not on file  . Number of children: Not on file  . Years of education: Not on file  . Highest education level: Not on file  Occupational History  . Not on file  Social Needs  . Financial resource strain: Not on file  . Food insecurity    Worry: Not on file    Inability: Not on file  . Transportation needs    Medical: Not on file    Non-medical: Not on file  Tobacco Use  . Smoking status: Never Smoker  . Smokeless tobacco: Never Used  Substance and Sexual Activity  . Alcohol use: Yes    Alcohol/week: 0.0 standard drinks  Comment: occassional  . Drug use: No  . Sexual activity: Not on file  Lifestyle  . Physical activity    Days per week: Not on file    Minutes per session: Not on file  . Stress: Not on file  Relationships  . Social Herbalist on phone: Not on file    Gets together: Not on file    Attends religious service: Not on file    Active member of club or organization: Not on file    Attends meetings of clubs or organizations: Not on file    Relationship status: Not on file  . Intimate  partner violence    Fear of current or ex partner: Not on file    Emotionally abused: Not on file    Physically abused: Not on file    Forced sexual activity: Not on file  Other Topics Concern  . Not on file  Social History Narrative  . Not on file   History reviewed. No pertinent family history. Allergies  Allergen Reactions  . Cefaclor     REACTION: unspecified    Medication list has been reviewed and updated.   General: Denies fever, chills, sweats. No significant weight loss. Eyes: Denies blurring,significant itching ENT: Denies earache, sore throat, and hoarseness. Cardiovascular: Denies chest pains, palpitations, dyspnea on exertion Respiratory: Denies cough, dyspnea at rest,wheeezing Breast: no concerns about lumps GI: Denies nausea, vomiting, diarrhea, constipation, change in bowel habits, abdominal pain, melena, hematochezia GU: Denies penile discharge, ED, urinary flow / outflow problems. No STD concerns. Musculoskeletal: Denies back pain, joint pain Derm: Denies rash, itching Neuro: Denies  paresthesias, frequent falls, frequent headaches Psych: Denies depression, anxiety Endocrine: Denies cold intolerance, heat intolerance, polydipsia Heme: Denies enlarged lymph nodes Allergy: No hayfever  Objective:   BP 118/76   Pulse 62   Temp 97.9 F (36.6 C)   Ht 5' 11.5" (1.816 m)   Wt 250 lb (113.4 kg)   SpO2 96%   BMI 34.38 kg/m  Fall Risk  12/25/2018 04/09/2013  Falls in the past year? 0 No  Number falls in past yr: 0 -  Injury with Fall? 0 -   Ideal Body Weight: Weight in (lb) to have BMI = 25: 181.4  Hearing Screening   125Hz  250Hz  500Hz  1000Hz  2000Hz  3000Hz  4000Hz  6000Hz  8000Hz   Right ear:   20 20 20  20     Left ear:   20 20 20  20       Visual Acuity Screening   Right eye Left eye Both eyes  Without correction: 20/15 20/13 20/100  With correction:     Comments: Saw both colors correct  Depression screen [facility] 2/9 12/25/2018 08/29/2017 04/09/2013   Decreased Interest 0 0 0  Down, Depressed, Hopeless 0 0 0  PHQ - 2 Score 0 0 0     GEN: well developed, well nourished, no acute distress Eyes: conjunctiva and lids normal, PERRLA, EOMI ENT: TM clear, nares clear, oral exam WNL Neck: supple, no lymphadenopathy, no thyromegaly, no JVD Pulm: clear to auscultation and percussion, respiratory effort normal CV: regular rate and rhythm, S1-S2, no murmur, rub or gallop, no bruits, peripheral pulses normal and symmetric, no cyanosis, clubbing, edema or varicosities GI: soft, non-tender; no hepatosplenomegaly, masses; active bowel sounds all quadrants GU: no hernia, testicular mass, penile discharge Lymph: no cervical, axillary or inguinal adenopathy MSK: gait normal, muscle tone and strength WNL, no joint swelling, effusions, discoloration, crepitus  SKIN: clear, good turgor, color  WNL, no rashes, lesions, or ulcerations Neuro: normal mental status, normal strength, sensation, and motion Psych: alert; oriented to person, place and time, normally interactive and not anxious or depressed in appearance.  All labs reviewed with patient. Results for orders placed or performed in visit on 12/17/18  PSA, Total with Reflex to PSA, Free  Result Value Ref Range   PSA, Total 3.2 < OR = 4.0 ng/mL  Hemoglobin A1c  Result Value Ref Range   Hgb A1c MFr Bld 6.1 4.6 - 6.5 %  CBC with Differential/Platelet  Result Value Ref Range   WBC 8.4 4.0 - 10.5 K/uL   RBC 5.19 4.22 - 5.81 Mil/uL   Hemoglobin 15.9 13.0 - 17.0 g/dL   HCT 16.147.1 09.639.0 - 04.552.0 %   MCV 90.8 78.0 - 100.0 fl   MCHC 33.8 30.0 - 36.0 g/dL   RDW 40.914.1 81.111.5 - 91.415.5 %   Platelets 218.0 150.0 - 400.0 K/uL   Neutrophils Relative % 57.2 43.0 - 77.0 %   Lymphocytes Relative 28.4 12.0 - 46.0 %   Monocytes Relative 9.6 3.0 - 12.0 %   Eosinophils Relative 4.3 0.0 - 5.0 %   Basophils Relative 0.5 0.0 - 3.0 %   Neutro Abs 4.8 1.4 - 7.7 K/uL   Lymphs Abs 2.4 0.7 - 4.0 K/uL   Monocytes Absolute 0.8 0.1  - 1.0 K/uL   Eosinophils Absolute 0.4 0.0 - 0.7 K/uL   Basophils Absolute 0.0 0.0 - 0.1 K/uL  Basic metabolic panel  Result Value Ref Range   Sodium 138 135 - 145 mEq/L   Potassium 4.5 3.5 - 5.1 mEq/L   Chloride 101 96 - 112 mEq/L   CO2 33 (H) 19 - 32 mEq/L   Glucose, Bld 110 (H) 70 - 99 mg/dL   BUN 19 6 - 23 mg/dL   Creatinine, Ser 7.821.08 0.40 - 1.50 mg/dL   Calcium 9.7 8.4 - 95.610.5 mg/dL   GFR 21.3068.52 >86.57>60.00 mL/min  Hepatic function panel  Result Value Ref Range   Total Bilirubin 0.7 0.2 - 1.2 mg/dL   Bilirubin, Direct 0.1 0.0 - 0.3 mg/dL   Alkaline Phosphatase 86 39 - 117 U/L   AST 25 0 - 37 U/L   ALT 28 0 - 53 U/L   Total Protein 7.3 6.0 - 8.3 g/dL   Albumin 4.6 3.5 - 5.2 g/dL  Lipid panel  Result Value Ref Range   Cholesterol 170 0 - 200 mg/dL   Triglycerides 846.9154.0 (H) 0.0 - 149.0 mg/dL   HDL 62.9541.20 >28.41>39.00 mg/dL   VLDL 32.430.8 0.0 - 40.140.0 mg/dL   LDL Cholesterol 98 0 - 99 mg/dL   Total CHOL/HDL Ratio 4    NonHDL 129.19     Assessment and Plan:     ICD-10-CM   1. Healthcare maintenance  Z00.00    He is really doing well, family is well.  Primary issue is to lose weight.   D/c ace and start arb, ? cough  Health Maintenance Exam: The patient's preventative maintenance and recommended screening tests for an annual wellness exam were reviewed in full today. Brought up to date unless services declined.  Counselled on the importance of diet, exercise, and its role in overall health and mortality. The patient's FH and SH was reviewed, including their home life, tobacco status, and drug and alcohol status.  Follow-up in 1 year for physical exam or additional follow-up below.  I have personally reviewed the Medicare Annual Wellness questionnaire and have noted  1. The patient's medical and social history 2. Their use of alcohol, tobacco or illicit drugs 3. Their current medications and supplements 4. The patient's functional ability including ADL's, fall risks, home safety  risks and hearing or visual             impairment. 5. Diet and physical activities 6. Evidence for depression or mood disorders 7. Reviewed Updated provider list, see scanned forms and [facility] Snapshot.  8. Reviewed whether or not the patient has HCPOA or living will, and discussed what this means with the patient.  Recommended he bring in a copy for his chart in [facility].  The patients weight, height, BMI and visual acuity have been recorded in the chart I have made referrals, counseling and provided education to the patient based review of the above and I have provided the pt with a written personalized care plan for preventive services.  I have provided the patient with a copy of your personalized plan for preventive services. Instructed to take the time to review along with their updated medication list.  Follow-up: No follow-ups on file. Or follow-up in 1 year if not noted.  Meds ordered this encounter  Medications  . losartan (COZAAR) 50 MG tablet    Sig: Take 1 tablet (50 mg total) by mouth daily.    Dispense:  90 tablet    Refill:  3   Medications Discontinued During This Encounter  Medication Reason  . azithromycin (ZITHROMAX) 250 MG tablet Completed Course  . predniSONE (DELTASONE) 20 MG tablet Completed Course  . lisinopril (ZESTRIL) 10 MG tablet    No orders of the defined types were placed in this encounter.   Signed,  Elpidio GaleaSpencer T. Janine Reller, MD   Allergies as of 12/25/2018      Reactions   Cefaclor    REACTION: unspecified      Medication List       Accurate as of December 25, 2018 11:59 PM. If you have any questions, ask your nurse or doctor.        STOP taking these medications   azithromycin 250 MG tablet Commonly known as: ZITHROMAX Stopped by: Hannah BeatSpencer Enrique Manganaro, MD   lisinopril 10 MG tablet Commonly known as: ZESTRIL Stopped by: Hannah BeatSpencer Zakir Henner, MD   predniSONE 20 MG tablet Commonly known as: DELTASONE Stopped by: Hannah BeatSpencer Mayson Sterbenz, MD     TAKE these  medications   albuterol 108 (90 Base) MCG/ACT inhaler Commonly known as: ProAir HFA INHALE 2 PUFFS INTO THE LUNGS EVERY 6 HOURS AS NEEDED FOR WHEEZING.   aspirin 81 MG tablet Take 81 mg by mouth daily.   atorvastatin 40 MG tablet Commonly known as: LIPITOR TAKE 1 TABLET BY MOUTH DAILY   Cinnamon 500 MG capsule Take 500 mg by mouth daily.   fish oil-omega-3 fatty acids 1000 MG capsule Take 1 g by mouth daily.   fluticasone 50 MCG/ACT nasal spray Commonly known as: FLONASE Place 2 sprays into the nose daily as needed.   GLUCOSAMINE 1500 COMPLEX PO Take by mouth.   levocetirizine 5 MG tablet Commonly known as: XYZAL Take 5 mg by mouth every evening.   losartan 50 MG tablet Commonly known as: COZAAR Take 1 tablet (50 mg total) by mouth daily. Started by: Hannah BeatSpencer Curstin Schmale, MD   montelukast 10 MG tablet Commonly known as: SINGULAIR Take 10 mg by mouth at bedtime.   multivitamin tablet Take 1 tablet by mouth daily.   omeprazole 20 MG capsule Commonly known as: PRILOSEC Take 2 capsules (  40 mg total) by mouth daily.   Turmeric 450 MG Caps Take 1 tablet by mouth daily.

## 2019-01-07 ENCOUNTER — Other Ambulatory Visit: Payer: Self-pay | Admitting: Family Medicine

## 2019-01-24 ENCOUNTER — Other Ambulatory Visit: Payer: Self-pay | Admitting: Family Medicine

## 2019-01-27 ENCOUNTER — Telehealth: Payer: Self-pay | Admitting: Family Medicine

## 2019-01-27 NOTE — Telephone Encounter (Signed)
Patient stated that at his physical his cough was discussed and his BP medication was changed. He stated that he has been taking the BP medication for 3 weeks now and the cough is still hanging around.  Would you like this patient to come back in for an appointment to further discuss ?

## 2019-01-27 NOTE — Telephone Encounter (Signed)
Please call  An ACE cough can last for a fairly long time.  I would give it 2 months at the very least

## 2019-01-27 NOTE — Telephone Encounter (Signed)
Mr. Pam notified as instructed by telephone.

## 2019-02-11 ENCOUNTER — Telehealth: Payer: Self-pay

## 2019-02-11 DIAGNOSIS — U071 COVID-19: Secondary | ICD-10-CM | POA: Insufficient documentation

## 2019-02-11 HISTORY — DX: COVID-19: U07.1

## 2019-02-11 NOTE — Telephone Encounter (Signed)
Pt left v/m; pt lives in [place] and usually sees Dr Lorelei Pont for annual visit. Pt wanted Dr Lorelei Pont to be aware and also to be noted on pts chart that 2 wks ago pt tested positive for covid. Pt has completed his 2 wk quarantine and had a mild case. FYI to Dr Lorelei Pont.

## 2019-03-27 ENCOUNTER — Ambulatory Visit (INDEPENDENT_AMBULATORY_CARE_PROVIDER_SITE_OTHER): Payer: Medicare Other | Admitting: Family Medicine

## 2019-03-27 ENCOUNTER — Other Ambulatory Visit: Payer: Self-pay

## 2019-03-27 ENCOUNTER — Encounter: Payer: Self-pay | Admitting: Family Medicine

## 2019-03-27 VITALS — BP 171/78 | HR 64 | Temp 97.8°F | Ht 71.5 in | Wt 254.0 lb

## 2019-03-27 DIAGNOSIS — Z23 Encounter for immunization: Secondary | ICD-10-CM | POA: Diagnosis not present

## 2019-03-27 DIAGNOSIS — I10 Essential (primary) hypertension: Secondary | ICD-10-CM | POA: Diagnosis not present

## 2019-03-27 MED ORDER — LOSARTAN POTASSIUM 100 MG PO TABS: 100.0000 mg | ORAL_TABLET | Freq: Every day | ORAL | 3 refills | Status: DC

## 2019-03-27 NOTE — Patient Instructions (Signed)
Take blood pressure every day in the morning for 2 weeks, then call in with your blood pressure numbers.

## 2019-03-27 NOTE — Progress Notes (Signed)
James Watts T. James Gorder, MD Primary Care and Sports Medicine [facility] [address] [place], [address] Phone: [phone]  FAX: [phone]  James Watts - 63 y.o. male  MRN [id number]  Date of Birth: 11-Feb-1956  Visit Date: 03/27/2019  PCP: Hannah Beat, MD  Referred by: Hannah Beat, MD  Chief Complaint  Patient presents with  . Hypertension   Subjective:   James Watts is a 63 y.o. very pleasant male patient who presents with the following:  F/u HTN: now on Cozaar 50 mg  He is a very nice patient who I know quite well.  He presents today with elevated blood pressures.  Prior in the fall I stopped his lisinopril secondary to cough and placed him on losartan 50 mg.  He decreased his dose to 25 mg and is had some elevated blood pressures with some headache and some discoloration reddish tent to his eyes.  He increase this to 50 mg 2 weeks ago, but he also has noted that his blood pressures continue to be high.  He presents today for advice.  D/c lisinopril in the fall  Increased urination  Past Medical History, Surgical History, Social History, Family History, Problem List, Medications, and Allergies have been reviewed and updated if relevant.  Patient Active Problem List   Diagnosis Date Noted  . COVID-19 02/11/2019  . RAD (reactive airway disease) with wheezing, mild intermittent, with acute exacerbation 03/29/2018  . Post-traumatic arthritis of ankle 08/13/2014  . Allergic rhinitis due to pollen 03/22/2011  . HYPERLIPIDEMIA 02/25/2007  . Essential hypertension 02/25/2007  . GERD (gastroesophageal reflux disease) 02/25/2007    Past Medical History:  Diagnosis Date  . Allergic rhinitis due to pollen 03/22/2011  . COVID-19 02/11/2019  . GERD (gastroesophageal reflux disease) 02/25/2007   Qualifier: Diagnosis of  By: Deirdre Priest MD, Gaynell Face    . HYPERLIPIDEMIA 02/25/2007   Qualifier: Diagnosis of  By: Deirdre Priest  MD, Gaynell Face    . HYPERTENSION 02/25/2007   Qualifier: Diagnosis of  By: Deirdre Priest MD, Gaynell Face      Past Surgical History:  Procedure Laterality Date  . TONSILLECTOMY      Social History   Socioeconomic History  . Marital status: Married    Spouse name: Not on file  . Number of children: Not on file  . Years of education: Not on file  . Highest education level: Not on file  Occupational History  . Not on file  Social Needs  . Financial resource strain: Not on file  . Food insecurity    Worry: Not on file    Inability: Not on file  . Transportation needs    Medical: Not on file    Non-medical: Not on file  Tobacco Use  . Smoking status: Never Smoker  . Smokeless tobacco: Never Used  Substance and Sexual Activity  . Alcohol use: Yes    Alcohol/week: 0.0 standard drinks    Comment: occassional  . Drug use: No  . Sexual activity: Not on file  Lifestyle  . Physical activity    Days per week: Not on file    Minutes per session: Not on file  . Stress: Not on file  Relationships  . Social Musician on phone: Not on file    Gets together: Not on file    Attends religious service: Not on file    Active member of club or organization: Not on file    Attends meetings of  clubs or organizations: Not on file    Relationship status: Not on file  . Intimate partner violence    Fear of current or ex partner: Not on file    Emotionally abused: Not on file    Physically abused: Not on file    Forced sexual activity: Not on file  Other Topics Concern  . Not on file  Social History Narrative  . Not on file    History reviewed. No pertinent family history.  Allergies  Allergen Reactions  . Cefaclor     REACTION: unspecified    Medication list reviewed and updated in full in [place].   GEN: No acute illnesses, no fevers, chills. GI: No n/v/d, eating normally Pulm: No SOB Interactive and getting along well at home.  Otherwise, ROS is as per the  HPI.  Objective:   BP (!) 171/78   Pulse 64   Temp 97.8 F (36.6 C) (Temporal)   Ht 5' 11.5" (1.816 m)   Wt 254 lb (115.2 kg)   SpO2 94%   BMI 34.93 kg/m   GEN: WDWN, NAD, Non-toxic, A & O x 3 HEENT: Atraumatic, Normocephalic. Neck supple. No masses, No LAD. Ears and Nose: No external deformity. CV: RRR, No M/G/R. No JVD. No thrill. No extra heart sounds. PULM: CTA B, no wheezes, crackles, rhonchi. No retractions. No resp. distress. No accessory muscle use. EXTR: No c/c/e NEURO Normal gait.  PSYCH: Normally interactive. Conversant. Not depressed or anxious appearing.  Calm demeanor.   Laboratory and Imaging Data:  Assessment and Plan:     ICD-10-CM   1. Essential hypertension  I10   2. Need for vaccination with 13-polyvalent pneumococcal conjugate vaccine  Z23 Pneumococcal conjugate vaccine 13-valent   Uncontrolled hypertension.  Continue and elevate losartan to 100 mg.  I asked him to check his blood pressure daily for the next 2 weeks, then send me the results.  If continues to be high, would increase this to 200 mg.  Follow-up: No follow-ups on file.  Meds ordered this encounter  Medications  . losartan (COZAAR) 100 MG tablet    Sig: Take 1 tablet (100 mg total) by mouth daily.    Dispense:  30 tablet    Refill:  3   Orders Placed This Encounter  Procedures  . Pneumococcal conjugate vaccine 13-valent    Signed,  Lashon Hillier T. Deeksha Cotrell, MD   Outpatient Encounter Medications as of 03/27/2019  Medication Sig  . albuterol (PROAIR HFA) 108 (90 Base) MCG/ACT inhaler INHALE 2 PUFFS INTO THE LUNGS EVERY 6 HOURS AS NEEDED FOR WHEEZING.  Marland Kitchen aspirin 81 MG tablet Take 81 mg by mouth daily.    Marland Kitchen atorvastatin (LIPITOR) 40 MG tablet TAKE 1 TABLET BY MOUTH DAILY  . Cinnamon 500 MG capsule Take 500 mg by mouth daily.  . fish oil-omega-3 fatty acids 1000 MG capsule Take 1 g by mouth daily.    . fluticasone (FLONASE) 50 MCG/ACT nasal spray Place 2 sprays into the nose daily as  needed.  . Glucosamine-Chondroit-Vit C-Mn (GLUCOSAMINE 1500 COMPLEX PO) Take by mouth.  . levocetirizine (XYZAL) 5 MG tablet Take 5 mg by mouth every evening.  Marland Kitchen losartan (COZAAR) 100 MG tablet Take 1 tablet (100 mg total) by mouth daily.  . montelukast (SINGULAIR) 10 MG tablet Take 10 mg by mouth at bedtime.  . Multiple Vitamin (MULTIVITAMIN) tablet Take 1 tablet by mouth daily.  Marland Kitchen omeprazole (PRILOSEC) 20 MG capsule TAKE 2 CAPSULES BY MOUTH DAILY  .  Turmeric 450 MG CAPS Take 1 tablet by mouth daily.  . [DISCONTINUED] losartan (COZAAR) 50 MG tablet Take 1 tablet (50 mg total) by mouth daily.   No facility-administered encounter medications on file as of 03/27/2019.

## 2019-04-10 ENCOUNTER — Encounter: Payer: Self-pay | Admitting: Family Medicine

## 2019-04-14 MED ORDER — HYDROCHLOROTHIAZIDE 12.5 MG PO TABS: 12.5000 mg | ORAL_TABLET | Freq: Every day | ORAL | 3 refills | Status: DC

## 2019-06-27 ENCOUNTER — Other Ambulatory Visit: Payer: Self-pay | Admitting: *Deleted

## 2019-06-27 MED ORDER — LOSARTAN POTASSIUM 100 MG PO TABS: 100.0000 mg | ORAL_TABLET | Freq: Every day | ORAL | 1 refills | Status: DC

## 2019-07-24 ENCOUNTER — Other Ambulatory Visit: Payer: Self-pay | Admitting: *Deleted

## 2019-07-24 MED ORDER — HYDROCHLOROTHIAZIDE 12.5 MG PO TABS: 12.5000 mg | ORAL_TABLET | Freq: Every day | ORAL | 1 refills | Status: DC

## 2019-11-06 ENCOUNTER — Other Ambulatory Visit: Payer: Self-pay

## 2019-11-06 ENCOUNTER — Ambulatory Visit (INDEPENDENT_AMBULATORY_CARE_PROVIDER_SITE_OTHER): Payer: Medicare Other | Admitting: Family Medicine

## 2019-11-06 ENCOUNTER — Encounter: Payer: Self-pay | Admitting: Family Medicine

## 2019-11-06 VITALS — BP 120/64 | HR 64 | Temp 97.9°F | Ht 71.5 in | Wt 253.8 lb

## 2019-11-06 DIAGNOSIS — S76212A Strain of adductor muscle, fascia and tendon of left thigh, initial encounter: Secondary | ICD-10-CM | POA: Diagnosis not present

## 2019-11-06 MED ORDER — PREDNISONE 20 MG PO TABS: ORAL_TABLET | ORAL | 0 refills | Status: DC

## 2019-11-06 NOTE — Progress Notes (Signed)
[name] [name], MD, CAQ Sports Medicine  Primary Care and Sports Medicine [facility] at [facility] [address] [place], [address]  Phone: [phone]   FAX: [phone]  James Watts - 64 y.o. male   MRN [id number]   Date of Birth: Oct 31, 1955  Date: 11/06/2019   PCP: Hannah Beat, MD   Referral: Hannah Beat, MD  Chief Complaint  Patient presents with   Pulled Muscle    Left Groin    This visit occurred during the SARS-CoV-2 public health emergency.  Safety protocols were in place, including screening questions prior to the visit, additional usage of staff PPE, and extensive cleaning of exam room while observing appropriate contact time as indicated for disinfecting solutions.   Subjective:   James Watts is a 64 y.o. very pleasant male patient with Body mass index is 34.9 kg/m. who presents with the following:  James Watts comes in today with some L groin pain:  Moving and put some things into his storage unit.  No specific time.  Has been moving every day.  Over the last few weeks he has been putting everything in storage, and he is anticipating traveling starting in August for extended periods of time throughout [place] as well as multiple locations in the [place].  Now he has some pain with no swelling and palpable defect in the anteromedial thigh.  He has been using some ice as well as Aleve and this does help somewhat with pain.  Is not had any sort of specific trauma or injury.   Review of Systems is noted in the HPI, as appropriate   Objective:   BP 120/64    Pulse 64    Temp 97.9 F (36.6 C) (Temporal)    Ht 5' 11.5" (1.816 m)    Wt 253 lb 12 oz (115.1 kg)    SpO2 96%    BMI 34.90 kg/m    GEN: No acute distress; alert,appropriate. PULM: Breathing comfortably in no respiratory distress PSYCH: Normally interactive.   HIP EXAM: SIDE: Left ROM: Abduction, Flexion, Internal and External range of motion: Abduction is  full, flexion and rotational movements are full but they do cause some pain GTB: NT SLR: NEG Knees: No effusion FABER: Pain Piriformis: NT at direct palpation Str: flexion: 4/5 abduction: 5/5 adduction: 3+/5 Strength testing tender  He does have tenderness palpation along the adductor longus and magnus  Radiology: No results found.  Assessment and Plan:     ICD-10-CM   1. Strain of groin, left, initial encounter  O70.962E    Groin strain, basic motion only.  Limit stretching secondary to need for scar formation.  Pulse with some steroids now and hopefully he can resume some more intense rehab quickly.  Follow-up: As needed as his travel schedule allows and if needed  Meds ordered this encounter  Medications   predniSONE (DELTASONE) 20 MG tablet    Sig: 2 tabs po daily for 5 days, then 1 tab po daily for 5 days    Dispense:  15 tablet    Refill:  0   Medications Discontinued During This Encounter  Medication Reason   montelukast (SINGULAIR) 10 MG tablet Completed Course   levocetirizine (XYZAL) 5 MG tablet Completed Course   No orders of the defined types were placed in this encounter.   Signed,  Elpidio Galea. Joshus Rogan, MD   Outpatient Encounter Medications as of 11/06/2019  Medication Sig   albuterol (PROAIR HFA) 108 (90 Base) MCG/ACT  inhaler INHALE 2 PUFFS INTO THE LUNGS EVERY 6 HOURS AS NEEDED FOR WHEEZING.   aspirin 81 MG tablet Take 81 mg by mouth daily.     atorvastatin (LIPITOR) 40 MG tablet TAKE 1 TABLET BY MOUTH DAILY   Cinnamon 500 MG capsule Take 500 mg by mouth daily.   fish oil-omega-3 fatty acids 1000 MG capsule Take 1 g by mouth daily.     fluticasone (FLONASE) 50 MCG/ACT nasal spray Place 2 sprays into the nose daily as needed.   Glucosamine-Chondroit-Vit C-Mn (GLUCOSAMINE 1500 COMPLEX PO) Take by mouth.   hydrochlorothiazide (HYDRODIURIL) 12.5 MG tablet Take 1 tablet (12.5 mg total) by mouth daily.   losartan (COZAAR) 100 MG tablet Take 1  tablet (100 mg total) by mouth daily.   Multiple Vitamin (MULTIVITAMIN) tablet Take 1 tablet by mouth daily.   omeprazole (PRILOSEC) 20 MG capsule TAKE 2 CAPSULES BY MOUTH DAILY   Turmeric 450 MG CAPS Take 1 tablet by mouth daily.   predniSONE (DELTASONE) 20 MG tablet 2 tabs po daily for 5 days, then 1 tab po daily for 5 days   [DISCONTINUED] levocetirizine (XYZAL) 5 MG tablet Take 5 mg by mouth every evening.   [DISCONTINUED] montelukast (SINGULAIR) 10 MG tablet Take 10 mg by mouth at bedtime.   No facility-administered encounter medications on file as of 11/06/2019.

## 2019-11-26 ENCOUNTER — Encounter: Payer: Self-pay | Admitting: Family Medicine

## 2019-11-26 ENCOUNTER — Ambulatory Visit (INDEPENDENT_AMBULATORY_CARE_PROVIDER_SITE_OTHER): Payer: Medicare Other | Admitting: Family Medicine

## 2019-11-26 ENCOUNTER — Other Ambulatory Visit: Payer: Self-pay

## 2019-11-26 VITALS — BP 130/80 | HR 76 | Temp 98.3°F | Ht 71.5 in | Wt 249.0 lb

## 2019-11-26 DIAGNOSIS — S76212A Strain of adductor muscle, fascia and tendon of left thigh, initial encounter: Secondary | ICD-10-CM | POA: Diagnosis not present

## 2019-11-26 DIAGNOSIS — M7062 Trochanteric bursitis, left hip: Secondary | ICD-10-CM | POA: Diagnosis not present

## 2019-11-26 DIAGNOSIS — M67952 Unspecified disorder of synovium and tendon, left thigh: Secondary | ICD-10-CM | POA: Diagnosis not present

## 2019-11-26 MED ORDER — METHYLPREDNISOLONE ACETATE 40 MG/ML IJ SUSP
80.0000 mg | Freq: Once | INTRAMUSCULAR | Status: AC
  Administered 2019-11-26: 80 mg via INTRA_ARTICULAR

## 2019-11-26 NOTE — Progress Notes (Signed)
Seda Kronberg T. Kalik Hoare, MD, CAQ Sports Medicine  Primary Care and Sports Medicine [facility] at [facility] [address] [place], [address]  Phone: [phone]  FAX: [phone]  Azarias Chiou - 64 y.o. male  MRN [id number]  Date of Birth: 08/01/1955  Date: 11/26/2019  PCP: Hannah Beat, MD  Referral: Hannah Beat, MD  Chief Complaint  Patient presents with  . Follow-up    Pulled Groin    This visit occurred during the SARS-CoV-2 public health emergency.  Safety protocols were in place, including screening questions prior to the visit, additional usage of staff PPE, and extensive cleaning of exam room while observing appropriate contact time as indicated for disinfecting solutions.   Subjective:   James Watts is a 64 y.o. very pleasant male patient with Body mass index is 34.24 kg/m. who presents with the following:  F/u L groin strain: When I last saw him, he was having pain in his hip flexor region on November 06, 2019.  At that point I did give him some oral steroids and gave him some rehab exercises to do.  This pain has minimized at that point, but he does have pain in the lateral hip on the left side.  This is from the lower lateral buttocks all the way down the lateral aspect of the thigh.  He has a deep ache at the trochanteric bursa.  Review of Systems is noted in the HPI, as appropriate   Objective:   BP 130/80   Pulse 76   Temp 98.3 F (36.8 C) (Temporal)   Ht 5' 11.5" (1.816 m)   Wt 249 lb (112.9 kg)   SpO2 95%   BMI 34.24 kg/m    GEN: No acute distress; alert,appropriate. PULM: Breathing comfortably in no respiratory distress PSYCH: Normally interactive.   HIP EXAM: SIDE: Left ROM: Abduction, Flexion, Internal and External range of motion: Full range of motion Pain with terminal IROM and EROM: Some pain with terminal E ROM GTB: TTP on the L SLR: NEG Knees: No effusion FABER: NT REVERSE FABER: NT,  neg Piriformis: NT at direct palpation Str: flexion: 5/5 abduction: 4+/5 adduction: 5/5 Strength testing non-tender     Radiology: No results found.  Assessment and Plan:     ICD-10-CM   1. Trochanteric bursitis of left hip  M70.62   2. Tendinopathy of left gluteus medius  M67.952 methylPREDNISolone acetate (DEPO-MEDROL) injection 80 mg  3. Strain of groin, left, initial encounter  S76.212A    Groin and hip flexor strain has improved.  Very minor at this point.  He does have some lateral hip pain which is consistent with trochanteric bursitis as well as some gluteus medius and minimus tendinopathy.  Altered his rehab program.  Aspiration/Injection Procedure Note Harjot Dibello 11-Aug-1955 Date of procedure: 11/26/2019  Procedure: Large Joint Aspiration / Injection of Hip, Trochanteric Bursa, L Indications: Pain  Procedure Details Verbal consent obtained. Risks (including infection, potential atrophy), benefits, and alternatives reviewed. Greater trochanter sterilely prepped with Chloraprep. Ethyl Chloride used for anesthesia. 8 cc of Lidocaine 1% injected with 2 mL of Depo-Medrol 40 mg into trochanteric bursa at area of maximal tenderness at greater trochanter. Needle taken to bone to troch bursa, flows easily. Bursa massaged. No bleeding and no complications. Decreased pain after injection. Needle: 22 gauge spinal needle Medication: 2 mL of Depo-Medrol 40 mg, equaling Depo-Medrol 80 mg total   Follow-up: No follow-ups on file.  Meds ordered this encounter  Medications  .  methylPREDNISolone acetate (DEPO-MEDROL) injection 80 mg   Medications Discontinued During This Encounter  Medication Reason  . predniSONE (DELTASONE) 20 MG tablet Completed Course   No orders of the defined types were placed in this encounter.   Signed,  Elpidio Galea. Elim Peale, MD   Outpatient Encounter Medications as of 11/26/2019  Medication Sig  . albuterol (PROAIR HFA) 108 (90 Base) MCG/ACT  inhaler INHALE 2 PUFFS INTO THE LUNGS EVERY 6 HOURS AS NEEDED FOR WHEEZING.  Marland Kitchen aspirin 81 MG tablet Take 81 mg by mouth daily.    Marland Kitchen atorvastatin (LIPITOR) 40 MG tablet TAKE 1 TABLET BY MOUTH DAILY  . Cinnamon 500 MG capsule Take 500 mg by mouth daily.  . fish oil-omega-3 fatty acids 1000 MG capsule Take 1 g by mouth daily.    . fluticasone (FLONASE) 50 MCG/ACT nasal spray Place 2 sprays into the nose daily as needed.  . Glucosamine-Chondroit-Vit C-Mn (GLUCOSAMINE 1500 COMPLEX PO) Take by mouth.  . hydrochlorothiazide (HYDRODIURIL) 12.5 MG tablet Take 1 tablet (12.5 mg total) by mouth daily.  Marland Kitchen losartan (COZAAR) 100 MG tablet Take 1 tablet (100 mg total) by mouth daily.  . Multiple Vitamin (MULTIVITAMIN) tablet Take 1 tablet by mouth daily.  Marland Kitchen omeprazole (PRILOSEC) 20 MG capsule TAKE 2 CAPSULES BY MOUTH DAILY  . Turmeric 450 MG CAPS Take 1 tablet by mouth daily.  . [DISCONTINUED] predniSONE (DELTASONE) 20 MG tablet 2 tabs po daily for 5 days, then 1 tab po daily for 5 days  . [EXPIRED] methylPREDNISolone acetate (DEPO-MEDROL) injection 80 mg    No facility-administered encounter medications on file as of 11/26/2019.

## 2019-12-15 ENCOUNTER — Other Ambulatory Visit: Payer: Self-pay | Admitting: Family Medicine

## 2019-12-18 ENCOUNTER — Other Ambulatory Visit: Payer: Medicare Other

## 2019-12-23 ENCOUNTER — Other Ambulatory Visit: Payer: Self-pay | Admitting: Family Medicine

## 2019-12-29 ENCOUNTER — Encounter: Payer: Medicare Other | Admitting: Family Medicine

## 2020-01-02 ENCOUNTER — Other Ambulatory Visit: Payer: Self-pay | Admitting: Family Medicine

## 2020-01-26 ENCOUNTER — Other Ambulatory Visit: Payer: Self-pay | Admitting: Family Medicine

## 2020-02-02 ENCOUNTER — Other Ambulatory Visit: Payer: Self-pay | Admitting: Family Medicine

## 2020-02-02 DIAGNOSIS — R7303 Prediabetes: Secondary | ICD-10-CM

## 2020-02-02 DIAGNOSIS — Z79899 Other long term (current) drug therapy: Secondary | ICD-10-CM

## 2020-02-02 DIAGNOSIS — Z125 Encounter for screening for malignant neoplasm of prostate: Secondary | ICD-10-CM

## 2020-02-02 DIAGNOSIS — E785 Hyperlipidemia, unspecified: Secondary | ICD-10-CM

## 2020-02-02 DIAGNOSIS — N400 Enlarged prostate without lower urinary tract symptoms: Secondary | ICD-10-CM

## 2020-02-06 ENCOUNTER — Other Ambulatory Visit: Payer: Self-pay

## 2020-02-06 ENCOUNTER — Other Ambulatory Visit (INDEPENDENT_AMBULATORY_CARE_PROVIDER_SITE_OTHER): Payer: Medicare Other

## 2020-02-06 DIAGNOSIS — Z79899 Other long term (current) drug therapy: Secondary | ICD-10-CM | POA: Diagnosis not present

## 2020-02-06 DIAGNOSIS — Z125 Encounter for screening for malignant neoplasm of prostate: Secondary | ICD-10-CM | POA: Diagnosis not present

## 2020-02-06 DIAGNOSIS — E785 Hyperlipidemia, unspecified: Secondary | ICD-10-CM | POA: Diagnosis not present

## 2020-02-06 DIAGNOSIS — R7303 Prediabetes: Secondary | ICD-10-CM | POA: Diagnosis not present

## 2020-02-06 DIAGNOSIS — N400 Enlarged prostate without lower urinary tract symptoms: Secondary | ICD-10-CM | POA: Diagnosis not present

## 2020-02-06 LAB — CBC WITH DIFFERENTIAL/PLATELET
Basophils Absolute: 0.1 10*3/uL (ref 0.0–0.1)
Basophils Relative: 0.8 % (ref 0.0–3.0)
Eosinophils Absolute: 0.3 10*3/uL (ref 0.0–0.7)
Eosinophils Relative: 3.1 % (ref 0.0–5.0)
HCT: 42.6 % (ref 39.0–52.0)
Hemoglobin: 14.6 g/dL (ref 13.0–17.0)
Lymphocytes Relative: 25.9 % (ref 12.0–46.0)
Lymphs Abs: 2.2 10*3/uL (ref 0.7–4.0)
MCHC: 34.3 g/dL (ref 30.0–36.0)
MCV: 91.1 fl (ref 78.0–100.0)
Monocytes Absolute: 0.8 10*3/uL (ref 0.1–1.0)
Monocytes Relative: 9.2 % (ref 3.0–12.0)
Neutro Abs: 5.1 10*3/uL (ref 1.4–7.7)
Neutrophils Relative %: 61 % (ref 43.0–77.0)
Platelets: 221 10*3/uL (ref 150.0–400.0)
RBC: 4.68 Mil/uL (ref 4.22–5.81)
RDW: 14.3 % (ref 11.5–15.5)
WBC: 8.4 10*3/uL (ref 4.0–10.5)

## 2020-02-06 LAB — PSA, MEDICARE: PSA: 4.18 ng/ml — ABNORMAL HIGH (ref 0.10–4.00)

## 2020-02-06 LAB — BASIC METABOLIC PANEL
BUN: 27 mg/dL — ABNORMAL HIGH (ref 6–23)
CO2: 30 mEq/L (ref 19–32)
Calcium: 9.5 mg/dL (ref 8.4–10.5)
Chloride: 103 mEq/L (ref 96–112)
Creatinine, Ser: 1.4 mg/dL (ref 0.40–1.50)
GFR: 50.61 mL/min — ABNORMAL LOW (ref >60.00)
Glucose, Bld: 105 mg/dL — ABNORMAL HIGH (ref 70–99)
Potassium: 4.1 mEq/L (ref 3.5–5.1)
Sodium: 141 mEq/L (ref 135–145)

## 2020-02-06 LAB — HEMOGLOBIN A1C: Hgb A1c MFr Bld: 6.2 % (ref 4.6–6.5)

## 2020-02-06 LAB — LIPID PANEL
Cholesterol: 163 mg/dL (ref 0–200)
HDL: 39.2 mg/dL (ref >39.00)
LDL Cholesterol: 102 mg/dL — ABNORMAL HIGH (ref 0–99)
NonHDL: 123.58
Total CHOL/HDL Ratio: 4
Triglycerides: 110 mg/dL (ref 0.0–149.0)
VLDL: 22 mg/dL (ref 0.0–40.0)

## 2020-02-06 LAB — HEPATIC FUNCTION PANEL
ALT: 20 U/L (ref 0–53)
AST: 19 U/L (ref 0–37)
Albumin: 4.3 g/dL (ref 3.5–5.2)
Alkaline Phosphatase: 68 U/L (ref 39–117)
Bilirubin, Direct: 0.2 mg/dL (ref 0.0–0.3)
Total Bilirubin: 0.8 mg/dL (ref 0.2–1.2)
Total Protein: 7.2 g/dL (ref 6.0–8.3)

## 2020-02-09 ENCOUNTER — Ambulatory Visit: Payer: Medicare Other | Admitting: Family Medicine

## 2020-02-11 ENCOUNTER — Ambulatory Visit: Payer: Medicare Other | Admitting: Family Medicine

## 2020-02-11 ENCOUNTER — Encounter: Payer: Self-pay | Admitting: Family Medicine

## 2020-02-11 ENCOUNTER — Other Ambulatory Visit: Payer: Self-pay

## 2020-02-11 ENCOUNTER — Ambulatory Visit (INDEPENDENT_AMBULATORY_CARE_PROVIDER_SITE_OTHER): Payer: Medicare Other

## 2020-02-11 ENCOUNTER — Ambulatory Visit: Payer: Self-pay

## 2020-02-11 ENCOUNTER — Ambulatory Visit (INDEPENDENT_AMBULATORY_CARE_PROVIDER_SITE_OTHER): Payer: Medicare Other | Admitting: Family Medicine

## 2020-02-11 VITALS — BP 130/82 | HR 71 | Ht 71.5 in | Wt 253.0 lb

## 2020-02-11 DIAGNOSIS — N183 Chronic kidney disease, stage 3 unspecified: Secondary | ICD-10-CM

## 2020-02-11 DIAGNOSIS — M25552 Pain in left hip: Secondary | ICD-10-CM

## 2020-02-11 DIAGNOSIS — N1831 Chronic kidney disease, stage 3a: Secondary | ICD-10-CM

## 2020-02-11 HISTORY — DX: Chronic kidney disease, stage 3 unspecified: N18.30

## 2020-02-11 NOTE — Patient Instructions (Addendum)
Thank you for coming in today.  Tylenol is ok.   Plan for injection if still bothersome when you return Dec 18th.  Please perform the exercise program that we have prepared for you and gone over in detail on a daily basis.  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

## 2020-02-11 NOTE — Progress Notes (Signed)
I, Christoper Fabian, LAT, ATC, am serving as scribe for Dr. Clementeen Graham.  James Watts is a 64 y.o. male who presents to [facility] at [facility] today for f/u of L groin/hip pain.  He was last seen by Dr. Patsy Lager on 11/26/19 and had a L GT injection and was provided an updated HEP.  Since his last visit, pt reports that his pain is better than it was in July but he leaves for [place] for 5 months.  He locates his pain to his L ant hip and proximal thigh.  Aggravating factors include hip extension w/ walking; active L hip flexion as in trying to lift his leg to get out of the car.  He denies any abdominal or low back pain.  He has been walking for exercise and has been doing a HEP as prescribed by Dr. Dallas Schimke.  He is also concerned about recent abnormalities in his BUN and GFR that he feels may be due to the amount of IBU and Aleve he has been taking for his hip pain. Patient will be traveling to [place] for an extended period of time starting at the end of this week.  He will return for few weeks in December 18.  Pertinent review of systems: No fevers or chills  Relevant historical information: Hypertension.  CKD 3 recently diagnosed with labs.   Exam:  BP 130/82 (BP Location: Left Arm, Patient Position: Sitting, Cuff Size: Large)   Pulse 71   Ht 5' 11.5" (1.816 m)   Wt 253 lb (114.8 kg)   SpO2 97%   BMI 34.79 kg/m  General: Well Developed, well nourished, and in no acute distress.   MSK: Left hip normal-appearing Decreased range of motion pain with flexion and internal rotation. Mild antalgic gait. Intact strength to hip flexion without pain. Hip abduction is slightly diminished 4+/5 without much pain.    Lab and Radiology Results  X-ray images left hip obtained today personally and independently interpreted. 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  No acute fractures visible. Await formal radiology  review   Assessment and Plan: 64 y.o. male with left hip pain ongoing for months now.  Failing initial conservative management with trials of greater trochanter injection and home exercise program.  Next step at this point in my opinion is either formal physical therapy or trial of diagnostic and therapeutic intra-articular hip injection.  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  I expect that he will likely require hip replacement before too long.  He is traveling to [place] for an extended period of time at the end of this week and will return on December 18.  ATC today reviewed home exercise program.  Recommend Tylenol for pain and avoiding high doses of NSAIDs due to his CKD.  Patient will keep me updated limit all his bowling.  Recheck with PCP or myself when he returns as needed.   PDMP not reviewed this encounter. Orders Placed This Encounter  Procedures  . DG HIP UNILAT W OR W/O PELVIS 2-3 VIEWS LEFT    Standing Status:   Future    Number of Occurrences:   1    Standing Expiration Date:   03/13/2020    Order Specific Question:   Reason for Exam (SYMPTOM  OR DIAGNOSIS REQUIRED)    Answer:   L hip pain    Order Specific Question:   Preferred imaging location?  Answer:   [facility]   No orders of the defined types were placed in this encounter.    Discussed warning signs or symptoms. Please see discharge instructions. Patient expresses understanding.   The above documentation has been reviewed and is accurate and complete Lynne Leader, M.D.

## 2020-02-12 NOTE — Progress Notes (Signed)
X-ray left hip shows medium arthritis.  No fracture or dislocation visible.

## 2020-02-13 ENCOUNTER — Ambulatory Visit (INDEPENDENT_AMBULATORY_CARE_PROVIDER_SITE_OTHER): Payer: Medicare Other | Admitting: Family Medicine

## 2020-02-13 ENCOUNTER — Other Ambulatory Visit: Payer: Self-pay

## 2020-02-13 ENCOUNTER — Encounter: Payer: Self-pay | Admitting: Family Medicine

## 2020-02-13 VITALS — BP 122/62 | HR 76 | Temp 97.5°F | Ht 71.0 in | Wt 252.0 lb

## 2020-02-13 DIAGNOSIS — N1831 Chronic kidney disease, stage 3a: Secondary | ICD-10-CM

## 2020-02-13 DIAGNOSIS — I10 Essential (primary) hypertension: Secondary | ICD-10-CM | POA: Diagnosis not present

## 2020-02-13 DIAGNOSIS — N401 Enlarged prostate with lower urinary tract symptoms: Secondary | ICD-10-CM

## 2020-02-13 DIAGNOSIS — R3911 Hesitancy of micturition: Secondary | ICD-10-CM

## 2020-02-13 DIAGNOSIS — R972 Elevated prostate specific antigen [PSA]: Secondary | ICD-10-CM | POA: Diagnosis not present

## 2020-02-13 DIAGNOSIS — Z0001 Encounter for general adult medical examination with abnormal findings: Secondary | ICD-10-CM | POA: Diagnosis not present

## 2020-02-13 DIAGNOSIS — R944 Abnormal results of kidney function studies: Secondary | ICD-10-CM

## 2020-02-13 DIAGNOSIS — E782 Mixed hyperlipidemia: Secondary | ICD-10-CM

## 2020-02-13 DIAGNOSIS — Z0000 Encounter for general adult medical examination without abnormal findings: Secondary | ICD-10-CM

## 2020-02-13 MED ORDER — TAMSULOSIN HCL 0.4 MG PO CAPS: 0.4000 mg | ORAL_CAPSULE | Freq: Every day | ORAL | 3 refills | Status: DC

## 2020-02-13 NOTE — Progress Notes (Signed)
Chief Complaint  Patient presents with  . Medicare Wellness    History of Present Illness: HPI   The patient  Of DR. COPLAND'spresents for annual medicare wellness, complete physical and review of chronic health problems. He/She also has the following acute concerns today:   Moving  To [place] for 5 months.   4 months ago he stated with pain in left hip after moving boxes... saw Dr., Patsy Lager.. treated with steroid. Dx with OA.   Now seeing Dr. Denyse Amass..  Will likely need replacement.Marland Kitchen doing home PT.  He has noted decreased urine flow.. Increased urinary frequency. Having nocturia 4 times  Ongoing x 3 months. No dysuria,  No blood in urine.  Told in past BPH.  Father BPH as well.     I have personally reviewed the Medicare Annual Wellness questionnaire and have noted 1. The patient's medical and social history 2. Their use of alcohol, tobacco or illicit drugs 3. Their current medications and supplements 4. The patient's functional ability including ADL's, fall risks, home safety risks and hearing or visual             impairment. 5. Diet and physical activities 6. Evidence for depression or mood disorders 7.         Updated provider list Cognitive evaluation was performed and recorded on pt medicare questionnaire form. The patients weight, height, BMI and visual acuity have been recorded in the chart  I have made referrals, counseling and provided education to the patient based review of the above and I have provided the pt with a written personalized care plan for preventive services.   Documentation of this information was scanned into the electronic record under the media tab.   Advance directives and end of life planning reviewed in detail with patient and documented in EMR. Patient given handout on advance care directives if needed. HCPOA and living will updated if needed.  Fall Risk  02/13/2020 12/25/2018 04/09/2013  Falls in the past year? 0 0 No  Number falls in past yr: 0  0 -  Injury with Fall? 0 0 -      [facility]/13/[facility] Total Score 0      Hearing Screening   Method: Audiometry   125Hz  250Hz  500Hz  1000Hz  2000Hz  3000Hz  4000Hz  6000Hz  8000Hz   Right ear:   25 25 25 25 25     Left ear:   25 25 25 25 25       Visual Acuity Screening   Right eye Left eye Both eyes  Without correction: 20/15 20/15 20/15   With correction:       Hypertension:    Good control on HCTZ, losartan BP Readings from Last 3 Encounters:  02/13/20 122/62  02/11/20 130/82  11/26/19 130/80  Using medication without problems or lightheadedness: none Chest pain with exertion: none Edema:none Short of breath:none Average home BPs: Other issues: Wt Readings from Last 3 Encounters:  02/13/20 252 lb (114.3 kg)  02/11/20 253 lb (114.8 kg)  11/26/19 249 lb (112.9 kg)     Elevated Cholesterol: At goal of atorvastatin. Lab Results  Component Value Date   CHOL 163 02/06/2020   HDL 39.20 02/06/2020   LDLCALC 102 (H) 02/06/2020   LDLDIRECT 112.5 03/22/2011   TRIG 110.0 02/06/2020   CHOLHDL 4 02/06/2020  Using medications without problems: Muscle aches:  Diet compliance: Exercise: minimal Other complaints:     Acute renal disease.04/14/20 GFR decreased to 50 and Cr 1.4  For 3 months was using Advil an aleve for hip issue. Now has stopped.  This visit occurred during the SARS-CoV-2 public health emergency.  Safety protocols were in place, including screening questions prior to the visit, additional usage of staff PPE, and extensive cleaning of exam room while observing appropriate contact time as indicated for disinfecting solutions.   COVID 19 screen:  No recent travel or known exposure to COVID19 The patient denies respiratory symptoms of COVID 19 at this time. The importance of social distancing was discussed today.     Review of Systems  Constitutional: Negative for chills and fever.  HENT: Negative for congestion and ear  pain.   Eyes: Negative for pain and redness.  Respiratory: Negative for cough and shortness of breath.   Cardiovascular: Negative for chest pain, palpitations and leg swelling.  Gastrointestinal: Negative for abdominal pain, blood in stool, constipation, diarrhea, nausea and vomiting.  Genitourinary: Negative for dysuria.  Musculoskeletal: Negative for falls and myalgias.  Skin: Negative for rash.  Neurological: Negative for dizziness.  Psychiatric/Behavioral: Negative for depression. The patient is not nervous/anxious.       Past Medical History:  Diagnosis Date  . Allergic rhinitis due to pollen 03/22/2011  . COVID-19 02/11/2019  . GERD (gastroesophageal reflux disease) 02/25/2007   Qualifier: Diagnosis of  By: Deirdre Priest MD, Gaynell Face    . HYPERLIPIDEMIA 02/25/2007   Qualifier: Diagnosis of  By: Deirdre Priest MD, Gaynell Face    . HYPERTENSION 02/25/2007   Qualifier: Diagnosis of  By: Deirdre Priest MD, Gaynell Face      reports that he has never smoked. He has never used smokeless tobacco. He reports current alcohol use. He reports that he does not use drugs.   Current Outpatient Medications:  .  albuterol (PROAIR HFA) 108 (90 Base) MCG/ACT inhaler, INHALE 2 PUFFS INTO THE LUNGS EVERY 6 HOURS AS NEEDED FOR WHEEZING., Disp: 18 g, Rfl: 1 .  aspirin 81 MG tablet, Take 81 mg by mouth daily.  , Disp: , Rfl:  .  atorvastatin (LIPITOR) 40 MG tablet, TAKE 1 TABLET BY MOUTH DAILY, Disp: 90 tablet, Rfl: 0 .  Cinnamon 500 MG capsule, Take 500 mg by mouth daily., Disp: , Rfl:  .  fish oil-omega-3 fatty acids 1000 MG capsule, Take 1 g by mouth daily.  , Disp: , Rfl:  .  fluticasone (FLONASE) 50 MCG/ACT nasal spray, Place 2 sprays into the nose daily as needed., Disp: 16 g, Rfl: 2 .  Glucosamine-Chondroit-Vit C-Mn (GLUCOSAMINE 1500 COMPLEX PO), Take by mouth., Disp: , Rfl:  .  hydrochlorothiazide (HYDRODIURIL) 12.5 MG tablet, TAKE 1 TABLET(12.5 MG) BY MOUTH DAILY, Disp: 90 tablet, Rfl: 0 .  losartan (COZAAR) 100  MG tablet, TAKE 1 TABLET(100 MG) BY MOUTH DAILY, Disp: 90 tablet, Rfl: 0 .  Multiple Vitamin (MULTIVITAMIN) tablet, Take 1 tablet by mouth daily., Disp: , Rfl:  .  omeprazole (PRILOSEC) 20 MG capsule, TAKE 2 CAPSULES BY MOUTH DAILY, Disp: 180 capsule, Rfl: 0 .  Turmeric 450 MG CAPS, Take 1 tablet by mouth daily., Disp: , Rfl:    Observations/Objective: Blood pressure 122/62, pulse 76, temperature (!) 97.5 F (36.4 C), temperature source Temporal, height 5\' 11"  (1.803 m), weight 252 lb (114.3 kg), SpO2 96 %.  Physical Exam Constitutional:      General: He is not in acute distress.    Appearance: Normal appearance. He is well-developed. He is not ill-appearing or toxic-appearing.  HENT:     Head: Normocephalic and atraumatic.     Right  Ear: Hearing, tympanic membrane, ear canal and external ear normal.     Left Ear: Hearing, tympanic membrane, ear canal and external ear normal.     Nose: Nose normal.     Mouth/Throat:     Pharynx: Uvula midline.  Eyes:     General: Lids are normal. Lids are everted, no foreign bodies appreciated.     Conjunctiva/sclera: Conjunctivae normal.     Pupils: Pupils are equal, round, and reactive to light.  Neck:     Thyroid: No thyroid mass or thyromegaly.     Vascular: No carotid bruit.     Trachea: Trachea and phonation normal.  Cardiovascular:     Rate and Rhythm: Normal rate and regular rhythm.     Pulses: Normal pulses.     Heart sounds: S1 normal and S2 normal. No murmur heard.  No gallop.   Pulmonary:     Breath sounds: Normal breath sounds. No wheezing, rhonchi or rales.  Abdominal:     General: Bowel sounds are normal.     Palpations: Abdomen is soft.     Tenderness: There is no abdominal tenderness. There is no guarding or rebound.     Hernia: No hernia is present. There is no hernia in the left inguinal area.  Genitourinary:    Penis: Normal. No paraphimosis or tenderness.      Testes: Normal.        Right: Mass or tenderness not  present.        Left: Mass or tenderness not present.     Prostate: Normal. Not enlarged and not tender.     Rectum: Guaiac result negative. No mass, tenderness, anal fissure, external hemorrhoid or internal hemorrhoid. Normal anal tone.  Musculoskeletal:     Cervical back: Normal range of motion and neck supple.  Lymphadenopathy:     Cervical: No cervical adenopathy.  Skin:    General: Skin is warm and dry.     Findings: No rash.  Neurological:     Mental Status: He is alert.     Cranial Nerves: No cranial nerve deficit.     Sensory: No sensory deficit.     Gait: Gait normal.     Deep Tendon Reflexes: Reflexes are normal and symmetric.  Psychiatric:        Speech: Speech normal.        Behavior: Behavior normal.        Judgment: Judgment normal.      Assessment and Plan   The patient's preventative maintenance and recommended screening tests for an annual wellness exam were reviewed in full today. Brought up to date unless services declined.  Counselled on the importance of diet, exercise, and its role in overall health and mortality. The patient's FH and SH was reviewed, including their home life, tobacco status, and drug and alcohol status.    Vaccines: S/ P 3 COVID vaccines, FLU,  Prostate Cancer Screen: Lab Results  Component Value Date   PSA 4.18 (H) 02/06/2020   PSA 3.02 08/23/2017   PSA 1.39 11/24/2015   Colon Cancer Screen:  2018      Smoking Status: nonsmoker. ETOH/ drug use:  1 beer a week/none  Hep C: yes  HIV screen:    Essential hypertension Well controlled. Continue current medication.   HYPERLIPIDEMIA At goal on atorvastatin.  CKD (chronic kidney disease) stage 3, GFR 30-59 ml/min (HCC) Re-eval, Stop NSAIDs.   Benign prostatic hyperplasia with urinary hesitancy Start trial of flomax.  Anesha Hackert, MD   

## 2020-02-13 NOTE — Patient Instructions (Addendum)
Stop NSAIDs.   Increase water. Start flomax daily.. follow BP if running low.. can stop HCTZ.

## 2020-03-17 NOTE — Assessment & Plan Note (Signed)
Start trial of flomax.

## 2020-03-17 NOTE — Assessment & Plan Note (Addendum)
Re-eval, Stop NSAIDs.

## 2020-03-17 NOTE — Assessment & Plan Note (Signed)
Well controlled. Continue current medication.  

## 2020-03-17 NOTE — Assessment & Plan Note (Signed)
At goal on atorvastatin.  

## 2020-04-21 ENCOUNTER — Other Ambulatory Visit: Payer: Self-pay | Admitting: Family Medicine

## 2020-04-26 ENCOUNTER — Other Ambulatory Visit (INDEPENDENT_AMBULATORY_CARE_PROVIDER_SITE_OTHER): Payer: Medicare Other

## 2020-04-26 ENCOUNTER — Other Ambulatory Visit: Payer: Self-pay

## 2020-04-26 DIAGNOSIS — R944 Abnormal results of kidney function studies: Secondary | ICD-10-CM | POA: Diagnosis not present

## 2020-04-26 DIAGNOSIS — R972 Elevated prostate specific antigen [PSA]: Secondary | ICD-10-CM

## 2020-04-26 LAB — BASIC METABOLIC PANEL
BUN: 18 mg/dL (ref 6–23)
CO2: 32 mEq/L (ref 19–32)
Calcium: 9.6 mg/dL (ref 8.4–10.5)
Chloride: 101 mEq/L (ref 96–112)
Creatinine, Ser: 1.17 mg/dL (ref 0.40–1.50)
GFR: 64.81 mL/min (ref >60.00)
Glucose, Bld: 103 mg/dL — ABNORMAL HIGH (ref 70–99)
Potassium: 4.2 mEq/L (ref 3.5–5.1)
Sodium: 139 mEq/L (ref 135–145)

## 2020-04-27 ENCOUNTER — Other Ambulatory Visit: Payer: Medicare Other

## 2020-04-27 LAB — PSA, TOTAL AND FREE
PSA, % Free: 31 % (calc) (ref >25)
PSA, Free: 0.9 ng/mL
PSA, Total: 2.9 ng/mL (ref <=4.0)

## 2020-04-28 ENCOUNTER — Other Ambulatory Visit: Payer: Self-pay

## 2020-04-28 ENCOUNTER — Encounter: Payer: Self-pay | Admitting: Family Medicine

## 2020-04-28 ENCOUNTER — Ambulatory Visit (INDEPENDENT_AMBULATORY_CARE_PROVIDER_SITE_OTHER): Payer: Medicare Other | Admitting: Family Medicine

## 2020-04-28 VITALS — BP 120/72 | HR 74 | Temp 98.3°F | Ht 71.0 in | Wt 242.5 lb

## 2020-04-28 DIAGNOSIS — M1612 Unilateral primary osteoarthritis, left hip: Secondary | ICD-10-CM | POA: Diagnosis not present

## 2020-04-28 DIAGNOSIS — M25552 Pain in left hip: Secondary | ICD-10-CM

## 2020-04-28 MED ORDER — TAMSULOSIN HCL 0.4 MG PO CAPS: 0.4000 mg | ORAL_CAPSULE | Freq: Every day | ORAL | 3 refills | Status: DC

## 2020-04-28 MED ORDER — PREDNISONE 20 MG PO TABS: ORAL_TABLET | ORAL | 0 refills | Status: DC

## 2020-04-28 NOTE — Progress Notes (Signed)
James Chicoine T. Tanise [name], MD, CAQ Sports Medicine  Primary Care and Sports Medicine [facility] at [facility] [address] [place], [address]  Phone: [phone]  FAX: [phone]  James Watts - 64 y.o. male  MRN [id number]  Date of Birth: 22-Oct-1955  Date: 04/28/2020  PCP: Hannah Beat, MD  Referral: Hannah Beat, MD  Chief Complaint  Patient presents with  . Hip Pain    Left    This visit occurred during the SARS-CoV-2 public health emergency.  Safety protocols were in place, including screening questions prior to the visit, additional usage of staff PPE, and extensive cleaning of exam room while observing appropriate contact time as indicated for disinfecting solutions.   Subjective:   James Watts is a 64 y.o. very pleasant male patient with Body mass index is 33.82 kg/m. who presents with the following:  L hip pain: He is a well-known patient and he presents with some anterior hip pain.  He has some pain with flexing at the hip, any does have some discomfort getting in and out of a car.  He has lost about 10 pounds, and his goal is to lose 10 pounds additionally in the near future.  At this point, he does not have any significant lateral pain, and he is not having any significant low back pain.  He does have some pain down into the thigh, but he does not have any radicular pain down in the lower extremity and he has minimal tingling.  Still will have some ongoing hip pain  Lost 10 pounds. Goal to lose 10 more pounds.    I independently reviewed his plain films of the left hip, and my interpretation would characterize this as mild degenerative changes of the hip.  Review of Systems is noted in the HPI, as appropriate   Objective:   BP 120/72   Pulse 74   Temp 98.3 F (36.8 C) (Temporal)   Ht 5\' 11"  (1.803 m)   Wt 242 lb 8 oz (110 kg)   SpO2 95%   BMI 33.82 kg/m    HIP EXAM: SIDE: Left ROM: Abduction, Flexion, Internal  and External range of motion: No significant limitation in abduction.  With the hip flexed to 90 does have mild amount of loss of motion Pain with terminal IROM and EROM: None significantly GTB: NT SLR: NEG Knees: No effusion FABER: Minimal tenderness Piriformis: NT at direct palpation Str: flexion: 4/5 abduction: 5/5 adduction: 4/5 Strength testing moderately tender with hip flexion and abduction    Radiology: No results found.  Assessment and Plan:     ICD-10-CM   1. Left hip pain  M25.552   2. Primary osteoarthritis of left hip  M16.12    Left hip pain with some aggravation with underlying arthritis.  I torqued on his hip quite a bit, and I am not convinced that this is 100% arthritis.  He does have significant pain in weakness with flexion and abduction.  At this point, I think that having him do some basic strengthening and range of motion work at the hip would be very beneficial.  Again to give him some oral steroids as well to try to calm him down acutely  Meds ordered this encounter  Medications  . tamsulosin (FLOMAX) 0.4 MG CAPS capsule    Sig: Take 1 capsule (0.4 mg total) by mouth daily.    Dispense:  90 capsule    Refill:  3  . predniSONE (DELTASONE) 20 MG  tablet    Sig: 2 tabs po daily for 5 days, then 1 tab po daily for 5 days    Dispense:  15 tablet    Refill:  0   Medications Discontinued During This Encounter  Medication Reason  . tamsulosin (FLOMAX) 0.4 MG CAPS capsule Reorder   No orders of the defined types were placed in this encounter.   Follow-up: No follow-ups on file.  Signed,  Elpidio Galea. Missael Ferrari, MD   Outpatient Encounter Medications as of 04/28/2020  Medication Sig  . albuterol (PROAIR HFA) 108 (90 Base) MCG/ACT inhaler INHALE 2 PUFFS INTO THE LUNGS EVERY 6 HOURS AS NEEDED FOR WHEEZING.  Marland Kitchen aspirin 81 MG tablet Take 81 mg by mouth daily.  Marland Kitchen atorvastatin (LIPITOR) 40 MG tablet TAKE 1 TABLET BY MOUTH DAILY  . Cinnamon 500 MG capsule Take  500 mg by mouth daily.  . fish oil-omega-3 fatty acids 1000 MG capsule Take 1 g by mouth daily.  . fluticasone (FLONASE) 50 MCG/ACT nasal spray Place 2 sprays into the nose daily as needed.  . Glucosamine-Chondroit-Vit C-Mn (GLUCOSAMINE 1500 COMPLEX PO) Take by mouth.  . hydrochlorothiazide (HYDRODIURIL) 12.5 MG tablet TAKE 1 TABLET(12.5 MG) BY MOUTH DAILY  . losartan (COZAAR) 100 MG tablet TAKE 1 TABLET(100 MG) BY MOUTH DAILY  . Multiple Vitamin (MULTIVITAMIN) tablet Take 1 tablet by mouth daily.  Marland Kitchen omeprazole (PRILOSEC) 20 MG capsule TAKE 2 CAPSULES BY MOUTH DAILY  . Turmeric 450 MG CAPS Take 1 tablet by mouth daily.  . [DISCONTINUED] tamsulosin (FLOMAX) 0.4 MG CAPS capsule Take 1 capsule (0.4 mg total) by mouth daily.  . predniSONE (DELTASONE) 20 MG tablet 2 tabs po daily for 5 days, then 1 tab po daily for 5 days  . tamsulosin (FLOMAX) 0.4 MG CAPS capsule Take 1 capsule (0.4 mg total) by mouth daily.   No facility-administered encounter medications on file as of 04/28/2020.

## 2020-08-30 ENCOUNTER — Ambulatory Visit (INDEPENDENT_AMBULATORY_CARE_PROVIDER_SITE_OTHER): Payer: Medicare Other | Admitting: Family Medicine

## 2020-08-30 ENCOUNTER — Other Ambulatory Visit: Payer: Self-pay

## 2020-08-30 ENCOUNTER — Encounter: Payer: Self-pay | Admitting: Family Medicine

## 2020-08-30 ENCOUNTER — Ambulatory Visit (INDEPENDENT_AMBULATORY_CARE_PROVIDER_SITE_OTHER)
Admission: RE | Admit: 2020-08-30 | Discharge: 2020-08-30 | Disposition: A | Discharge status: Home / Self Care | Payer: Medicare Other | Source: Ambulatory Visit | Attending: Family Medicine | Admitting: Family Medicine

## 2020-08-30 VITALS — BP 120/72 | HR 69 | Temp 98.2°F | Ht 71.0 in | Wt 246.5 lb

## 2020-08-30 DIAGNOSIS — M1612 Unilateral primary osteoarthritis, left hip: Secondary | ICD-10-CM

## 2020-08-30 NOTE — Progress Notes (Signed)
Jlon Betker T. Sanai Frick, MD, CAQ Sports Medicine  Primary Care and Sports Medicine [facility] at [facility] [address] [place], [address]  Phone: [phone]  FAX: [phone]  Lino Wickliff - 65 y.o. male  MRN [id number]  Date of Birth: August 18, 1955  Date: 08/30/2020  PCP: Hannah Beat, MD  Referral: Hannah Beat, MD  Chief Complaint  Patient presents with  . Hip Pain    Left    This visit occurred during the SARS-CoV-2 public health emergency.  Safety protocols were in place, including screening questions prior to the visit, additional usage of staff PPE, and extensive cleaning of exam room while observing appropriate contact time as indicated for disinfecting solutions.   Subjective:   James Watts is a 65 y.o. very pleasant male patient with Body mass index is 34.38 kg/m. who presents with the following:  Known L hip OA: He has a well-known patient, he presents with some worsening left-sided hip pain as well as worsened range of motion at the hip.  He has done a good job with strengthening of his hip, working on his range of motion.  I given him some oral steroids as well as some trochanteric bursa injections secondary to trochanteric bursitis in the past as well.  He also did have some significant weakness in and about the hip in all directions, and he is done a great job strengthening this.  He is retired, he and his wife travel throughout the world.  Right now his hip is limiting him in his functional capacity on a day-to-day basis.  Dramatic loss of motion: He is having difficulty putting on his pants and getting in and out of a car with rotational movement.  He is not able to classically cross his legs.  Review of Systems is noted in the HPI, as appropriate   Patient Active Problem List   Diagnosis Date Noted  . 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 CKD (chronic kidney disease) stage 3, GFR 30-59 ml/min (HCC)  02/11/2020  . COVID-19 02/11/2019  . Benign prostatic hyperplasia with urinary hesitancy 03/22/2009  . HYPERLIPIDEMIA 02/25/2007  . Essential hypertension 02/25/2007  . GERD (gastroesophageal reflux disease) 02/25/2007    Past Medical History:  Diagnosis Date  . COVID-19 02/11/2019  . GERD (gastroesophageal reflux disease) 02/25/2007   Qualifier: Diagnosis of  By: Deirdre Priest MD, Gaynell Face    . 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 HYPERLIPIDEMIA 02/25/2007   Qualifier: Diagnosis of  By: Deirdre Priest MD, Gaynell Face    . HYPERTENSION 02/25/2007   Qualifier: Diagnosis of  By: Deirdre Priest MD, Gaynell Face      Past Surgical History:  Procedure Laterality Date  . TONSILLECTOMY    . TOTAL ANKLE ARTHROPLASTY Right 2016   [facility]    History reviewed. No pertinent family history.   Objective:   BP 120/72   Pulse 69   Temp 98.2 F (36.8 C) (Temporal)   Ht 5\' 11"  (1.803 m)   Wt 246 lb 8 oz (111.8 kg)   SpO2 97%   BMI 34.38 kg/m   GEN: No acute distress; alert,appropriate. PULM: Breathing comfortably in no respiratory distress PSYCH: Normally interactive.    Left hip: Patient has significant pain with abduction at the hip which is quite limited.  The patient is unable to fully flex her hip to 90 degrees, but when approaching in approximately 80 degrees he has no ability to internal rotate the hip, and he is only able to  externally rotate the approximately 10 degrees.  Terminal motion causes him significant pain in all directions.  He also does have some pain with logroll. Today, he has no tenderness at the lateral trochanteric bursa as well as no pain in the posterior pelvis. Strength is 5/5 today.  Otherwise he is neurovascularly intact.  Radiology: DG HIP UNILAT WITH PELVIS 2-3 VIEWS LEFT  Result Date: 08/30/2020 CLINICAL DATA:  Osteoarthritis of the left hip. EXAM: DG HIP (WITH OR WITHOUT PELVIS) 2-3V LEFT COMPARISON:  February 11, 2020. FINDINGS: There is no  evidence of hip fracture or dislocation. Stable moderate narrowing and osteophyte formation of the left hip is noted. IMPRESSION: Stable moderate osteoarthritis of the left hip. No acute abnormality is noted. Electronically Signed   By: Lupita Raider M.D.   On: 08/30/2020 13:40    Assessment and Plan:     ICD-10-CM   1. Osteoarthritis of one hip, left  M16.12 DG HIP UNILAT WITH PELVIS 2-3 VIEWS LEFT   On my review, the patient's joint space does appear to be more narrowed compared to his prior hip x-ray from less than 1 year ago.  Clinically, he is also worsened and his range of motion has gotten to be quite poor.  He is having functional restriction from his pain and loss of motion at the hip.  This is causing him significant limitation in day-to-day activity, let alone his very active lifestyle.  I think at this point he is ready to talk about options with one of the total joint surgeons.  His daughter is a Engineer, civil (consulting) at [employer], and he lives in that direction, so I think that consulting their orthopedic service is most appropriate at this point.  Orders Placed This Encounter  Procedures  . DG HIP UNILAT WITH PELVIS 2-3 VIEWS LEFT    Signed,  Jami Ohlin T. Caillou Minus, MD   Outpatient Encounter Medications as of 08/30/2020  Medication Sig  . albuterol (PROAIR HFA) 108 (90 Base) MCG/ACT inhaler INHALE 2 PUFFS INTO THE LUNGS EVERY 6 HOURS AS NEEDED FOR WHEEZING.  Marland Kitchen aspirin 81 MG tablet Take 81 mg by mouth daily.  Marland Kitchen atorvastatin (LIPITOR) 40 MG tablet TAKE 1 TABLET BY MOUTH DAILY  . Cinnamon 500 MG capsule Take 500 mg by mouth daily.  . fish oil-omega-3 fatty acids 1000 MG capsule Take 1 g by mouth daily.  . fluticasone (FLONASE) 50 MCG/ACT nasal spray Place 2 sprays into the nose daily as needed.  . Glucosamine-Chondroit-Vit C-Mn (GLUCOSAMINE 1500 COMPLEX PO) Take by mouth.  . hydrochlorothiazide (HYDRODIURIL) 12.5 MG tablet TAKE 1 TABLET(12.5 MG) BY MOUTH DAILY  . losartan (COZAAR) 100 MG tablet  TAKE 1 TABLET(100 MG) BY MOUTH DAILY  . Multiple Vitamin (MULTIVITAMIN) tablet Take 1 tablet by mouth daily.  Marland Kitchen omeprazole (PRILOSEC) 20 MG capsule TAKE 2 CAPSULES BY MOUTH DAILY  . predniSONE (DELTASONE) 20 MG tablet 2 tabs po daily for 5 days, then 1 tab po daily for 5 days  . tamsulosin (FLOMAX) 0.4 MG CAPS capsule Take 1 capsule (0.4 mg total) by mouth daily.  . Turmeric 450 MG CAPS Take 1 tablet by mouth daily.   No facility-administered encounter medications on file as of 08/30/2020.

## 2020-08-30 NOTE — Patient Instructions (Addendum)
Alleve 2 tabs by mouth two times a day over the counter:  (GENERIC CHEAPER EQUIVALENT IS NAPROXEN SODIUM)   We will call you about setting you up to see a Radiation protection practitioner

## 2020-08-31 ENCOUNTER — Encounter: Payer: Self-pay | Admitting: Family Medicine

## 2020-08-31 DIAGNOSIS — Z96661 Presence of right artificial ankle joint: Secondary | ICD-10-CM

## 2020-08-31 HISTORY — DX: Presence of right artificial ankle joint: Z96.661

## 2020-09-09 DIAGNOSIS — M1612 Unilateral primary osteoarthritis, left hip: Secondary | ICD-10-CM

## 2020-11-09 HISTORY — PX: JOINT REPLACEMENT: SHX530

## 2020-12-27 ENCOUNTER — Telehealth: Payer: Self-pay

## 2020-12-27 MED ORDER — ALBUTEROL SULFATE HFA 108 (90 BASE) MCG/ACT IN AERS: INHALATION_SPRAY | RESPIRATORY_TRACT | 1 refills | Status: DC

## 2020-12-27 NOTE — Telephone Encounter (Signed)
Pt called triage asking for a refill of his albuterol inhaler. States he uses it very little. The one he has is empty and he needs a new one. Send to [facility] (one in his chart).

## 2020-12-27 NOTE — Telephone Encounter (Signed)
Refill sent as requested. 

## 2020-12-29 ENCOUNTER — Telehealth: Payer: Self-pay | Admitting: Family Medicine

## 2020-12-29 DIAGNOSIS — N529 Male erectile dysfunction, unspecified: Secondary | ICD-10-CM

## 2020-12-29 NOTE — Telephone Encounter (Signed)
Called mr. James Watts and he stated that he is having ED issues

## 2020-12-29 NOTE — Addendum Note (Signed)
Addended by: Hannah Beat on: 12/29/2020 04:49 PM   Modules accepted: Orders

## 2020-12-29 NOTE — Telephone Encounter (Signed)
done

## 2020-12-29 NOTE — Telephone Encounter (Signed)
James Watts called in wanted to know about getting a referral to urology. And he prefers someone in the [place] or [employer].

## 2020-12-29 NOTE — Telephone Encounter (Signed)
Please find out why he is wanting to be referred to urology.  We have to have a reason.

## 2021-04-21 ENCOUNTER — Other Ambulatory Visit: Payer: Self-pay | Admitting: Family Medicine

## 2021-04-23 ENCOUNTER — Other Ambulatory Visit: Payer: Self-pay | Admitting: Family Medicine

## 2021-04-25 NOTE — Telephone Encounter (Signed)
Please schedule Medicare Wellness with health educator and CPE with fasting labs prior with Dr. Patsy Lager.

## 2021-04-25 NOTE — Telephone Encounter (Signed)
Pt stated he is in [place] for a week and will schedule AWV when he gets back also sent a mychart letter

## 2021-05-11 ENCOUNTER — Telehealth: Payer: Self-pay

## 2021-05-11 NOTE — Telephone Encounter (Signed)
[place] Night - Client Nonclinical Telephone Record  [facility] Client [place] Night - Client Client Site [place] Provider Owens Loffler - MD Contact Type Call Who Is Calling Patient / Member / Family / Caregiver Caller Name [place] Phone Number [phone] Patient Name James Watts Patient DOB [birth year] Call Type Message Only Information Provided Reason for Call Request to Schedule Office Appointment Initial Comment Caller states he is needing to schedule an appointment. Patient request to speak to RN No Additional Comment Declined triage. Office hours provided. Disp. Time Disposition Final User 05/11/2021 7:37:11 AM General Information Provided Yes Uvaldo Rising Call Closed By: Uvaldo Rising Transaction Date/Time: 05/11/2021 7:33:52 AM (ET

## 2021-05-11 NOTE — Telephone Encounter (Signed)
Called pt he has already scheduled appts

## 2021-05-19 ENCOUNTER — Other Ambulatory Visit: Payer: Self-pay

## 2021-05-19 ENCOUNTER — Other Ambulatory Visit: Payer: Medicare Other

## 2021-05-19 ENCOUNTER — Other Ambulatory Visit (INDEPENDENT_AMBULATORY_CARE_PROVIDER_SITE_OTHER): Payer: Medicare Other

## 2021-05-19 DIAGNOSIS — R7303 Prediabetes: Secondary | ICD-10-CM

## 2021-05-19 DIAGNOSIS — E782 Mixed hyperlipidemia: Secondary | ICD-10-CM

## 2021-05-19 DIAGNOSIS — Z125 Encounter for screening for malignant neoplasm of prostate: Secondary | ICD-10-CM

## 2021-05-19 DIAGNOSIS — Z79899 Other long term (current) drug therapy: Secondary | ICD-10-CM

## 2021-05-19 DIAGNOSIS — R972 Elevated prostate specific antigen [PSA]: Secondary | ICD-10-CM | POA: Diagnosis not present

## 2021-05-19 LAB — HEPATIC FUNCTION PANEL
ALT: 20 U/L (ref 0–53)
AST: 21 U/L (ref 0–37)
Albumin: 4.3 g/dL (ref 3.5–5.2)
Alkaline Phosphatase: 74 U/L (ref 39–117)
Bilirubin, Direct: 0.2 mg/dL (ref 0.0–0.3)
Total Bilirubin: 0.9 mg/dL (ref 0.2–1.2)
Total Protein: 6.9 g/dL (ref 6.0–8.3)

## 2021-05-19 LAB — CBC WITH DIFFERENTIAL/PLATELET
Basophils Absolute: 0 10*3/uL (ref 0.0–0.1)
Basophils Relative: 0.4 % (ref 0.0–3.0)
Eosinophils Absolute: 0.2 10*3/uL (ref 0.0–0.7)
Eosinophils Relative: 2.9 % (ref 0.0–5.0)
HCT: 41.3 % (ref 39.0–52.0)
Hemoglobin: 13.7 g/dL (ref 13.0–17.0)
Lymphocytes Relative: 24.6 % (ref 12.0–46.0)
Lymphs Abs: 1.9 10*3/uL (ref 0.7–4.0)
MCHC: 33.1 g/dL (ref 30.0–36.0)
MCV: 90 fl (ref 78.0–100.0)
Monocytes Absolute: 0.7 10*3/uL (ref 0.1–1.0)
Monocytes Relative: 9.1 % (ref 3.0–12.0)
Neutro Abs: 4.9 10*3/uL (ref 1.4–7.7)
Neutrophils Relative %: 63 % (ref 43.0–77.0)
Platelets: 211 10*3/uL (ref 150.0–400.0)
RBC: 4.59 Mil/uL (ref 4.22–5.81)
RDW: 15.2 % (ref 11.5–15.5)
WBC: 7.8 10*3/uL (ref 4.0–10.5)

## 2021-05-19 LAB — LIPID PANEL
Cholesterol: 161 mg/dL (ref 0–200)
HDL: 45.6 mg/dL (ref >39.00)
LDL Cholesterol: 91 mg/dL (ref 0–99)
NonHDL: 115.08
Total CHOL/HDL Ratio: 4
Triglycerides: 122 mg/dL (ref 0.0–149.0)
VLDL: 24.4 mg/dL (ref 0.0–40.0)

## 2021-05-19 LAB — BASIC METABOLIC PANEL
BUN: 21 mg/dL (ref 6–23)
CO2: 33 mEq/L — ABNORMAL HIGH (ref 19–32)
Calcium: 9.3 mg/dL (ref 8.4–10.5)
Chloride: 104 mEq/L (ref 96–112)
Creatinine, Ser: 1.17 mg/dL (ref 0.40–1.50)
GFR: 64.33 mL/min (ref >60.00)
Glucose, Bld: 101 mg/dL — ABNORMAL HIGH (ref 70–99)
Potassium: 4.5 mEq/L (ref 3.5–5.1)
Sodium: 141 mEq/L (ref 135–145)

## 2021-05-19 LAB — PSA, MEDICARE: PSA: 3.13 ng/ml (ref 0.10–4.00)

## 2021-05-19 LAB — HEMOGLOBIN A1C: Hgb A1c MFr Bld: 6.2 % (ref 4.6–6.5)

## 2021-05-20 ENCOUNTER — Other Ambulatory Visit: Payer: Self-pay | Admitting: Family Medicine

## 2021-05-20 NOTE — Telephone Encounter (Signed)
Pharmacy called to follow up on rx request, stated pt is leaving out of the country on [place]

## 2021-05-21 ENCOUNTER — Encounter: Payer: Self-pay | Admitting: Family Medicine

## 2021-05-22 ENCOUNTER — Encounter: Payer: Self-pay | Admitting: Family Medicine

## 2021-05-22 NOTE — Progress Notes (Signed)
James Watts T. Brystol Wasilewski, MD, CAQ [facility] [address] [place], [address]  Phone: [phone]   FAX: [phone]  Braylan Faul - 66 y.o. male   MRN [id number]   Date of Birth: 09-20-1955  Date: 05/23/2021   PCP: Hannah Beat, MD   Referral: Hannah Beat, MD  Chief Complaint  Patient presents with   Medicare Wellness    This visit occurred during the SARS-CoV-2 public health emergency.  Safety protocols were in place, including screening questions prior to the visit, additional usage of staff PPE, and extensive cleaning of exam room while observing appropriate contact time as indicated for disinfecting solutions.   Patient Care Team: Hannah Beat, MD as PCP - General (Family Medicine) Subjective:   Rayyan Burley is a 66 y.o. pleasant patient who presents for a medicare wellness examination:  Preventative Health Maintenance Visit:  Health Maintenance Summary Reviewed and updated, unless pt declines services.  Tobacco History Reviewed. Alcohol: No concerns, no excessive use Exercise Habits: Some activity, rec at least 30 mins 5 times a week - walking a lot.  Drug Use: None  Overall, he is a healthy gentleman, and does have a history of some high blood pressure and hyperlipidemia. He and his wife are trying to work through some things right now.  Going back to [place] tomorrow.   Health maintenance reviewed: COVID booster - before going back to [place] Flu done Pneumonia vaccine #2?  Up-to-date otherwise.  He did have an STD work-up about 1 year ago.      Health Maintenance  Topic Date Due   TETANUS/TDAP  07/12/2022   COLONOSCOPY (Pts 45-9yrs Insurance coverage will need to be confirmed)  03/07/2027   Pneumonia Vaccine 41+ Years old  Completed   INFLUENZA VACCINE  Completed   COVID-19 Vaccine  Completed   Hepatitis C Screening  Completed   Zoster Vaccines- Shingrix  Completed   HPV VACCINES   Aged Out    Immunization History  Administered Date(s) Administered   Influenza Split 03/22/2011, 02/13/2012   Influenza Whole 02/25/2007, 03/03/2008, 03/22/2009, 04/06/2010   Influenza, High Dose Seasonal PF 01/16/2019, 01/20/2020, 01/27/2021   Influenza,inj,Quad PF,6+ Mos 02/27/2013, 03/06/2014, 02/10/2015, 02/25/2016, 02/21/2017, 01/29/2018   Moderna Covid-19 Vaccine Bivalent Booster 34yrs & up 01/27/2021   PFIZER(Purple Top)SARS-COV-2 Vaccination 05/28/2019, 06/18/2019, 02/10/2020   Pneumococcal Conjugate-13 03/27/2019   Pneumococcal Polysaccharide-23 05/23/2021   Td 03/22/2009   Tdap 07/11/2012   Zoster Recombinat (Shingrix) 01/23/2019, 04/04/2019   Zoster, Live 03/17/2015    Patient Active Problem List   Diagnosis Date Noted   H/O total ankle replacement, 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   CKD (chronic kidney disease) stage 3, GFR 30-59 ml/min (HCC) 02/11/2020   Benign prostatic hyperplasia with urinary hesitancy 03/22/2009   HYPERLIPIDEMIA 02/25/2007   Essential hypertension 02/25/2007   GERD (gastroesophageal reflux disease) 02/25/2007    Past Medical History:  Diagnosis Date   GERD (gastroesophageal reflux disease) 02/25/2007   Qualifier: Diagnosis of  By: Deirdre Priest MD, Gaynell Face     H/O total ankle replacement, 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 No pertinent family history.  Past Medical History, Surgical History, Social History, Family History, Problem List, Medications, and Allergies have been reviewed and updated if relevant.  Review of Systems: Pertinent positives are listed above.  Otherwise, a full 14 point review  of systems has been done in full and it is negative except where it is noted positive.  Objective:   BP (!) 146/90    Pulse 78    Temp 98 F (36.7 C) (Temporal)    Ht 5' 11.5" (1.816 m)    Wt 245 lb 6 oz (111.3 kg)    SpO2 97%    BMI 33.75 kg/m  Fall Risk 04/09/2013 12/25/2018 02/13/2020 05/23/2021  Falls in the past year? No 0 0 0  Was there an injury with Fall? - 0 0 -  Fall Risk Category Calculator - 0 0 -  Fall Risk Category - Low Low -  Patient Fall Risk Level - Low fall risk Low fall risk -   Ideal Body Weight: Weight in (lb) to have BMI = 25: 181.4 Hearing Screening  Method: Audiometry   500Hz  1000Hz  2000Hz  4000Hz   Right ear 20 20 20 20   Left ear 20 20 20 20   Vision Screening - Comments:: Wears Glasses-Eye Exam 12/2020 Depression screen [facility] 2/9 05/23/2021 02/13/2020 12/25/2018 08/29/2017 04/09/2013  Decreased Interest 0 0 0 0 0  Down, Depressed, Hopeless 0 0 0 0 0  PHQ - 2 Score 0 0 0 0 0  Altered sleeping - 1 - - -  Tired, decreased energy - 1 - - -  Change in appetite - 0 - - -  Feeling bad or failure about yourself  - 0 - - -  Trouble concentrating - 0 - - -  Moving slowly or fidgety/restless - 0 - - -  Suicidal thoughts - 0 - - -  PHQ-9 Score - 2 - - -  Difficult doing work/chores - Not difficult at all - - -     GEN: well developed, well nourished, no acute distress Eyes: conjunctiva and lids normal, PERRLA, EOMI ENT: TM clear, nares clear, oral exam WNL Neck: supple, no lymphadenopathy, no thyromegaly, no JVD Pulm: clear to auscultation and percussion, respiratory effort normal CV: regular rate and rhythm, S1-S2, no murmur, rub or gallop, no bruits, peripheral pulses normal and symmetric, no cyanosis, clubbing, edema or varicosities GI: soft, non-tender; no hepatosplenomegaly, masses; active bowel sounds all quadrants GU: deferred Lymph: no cervical, axillary or inguinal adenopathy MSK: gait normal, muscle tone and strength WNL, no joint swelling, effusions,  discoloration, crepitus  SKIN: clear, good turgor, color WNL, no rashes, lesions, or ulcerations Neuro: normal mental status, normal strength, sensation, and motion Psych: alert; oriented to person, place and time, normally interactive and not anxious or depressed in appearance.  All labs reviewed with patient.  Results for orders placed or performed in visit on 05/19/21  Lipid panel  Result Value Ref Range   Cholesterol 161 0 - 200 mg/dL   Triglycerides 893.7 0.0 - 149.0 mg/dL   HDL 34.28 >76.81 mg/dL   VLDL 15.7 0.0 - 26.2 mg/dL   LDL Cholesterol 91 0 - 99 mg/dL   Total CHOL/HDL Ratio 4    NonHDL 115.08   Hepatic function panel  Result Value Ref Range   Total Bilirubin 0.9 0.2 - 1.2 mg/dL   Bilirubin, Direct 0.2 0.0 - 0.3 mg/dL   Alkaline Phosphatase 74 39 - 117 U/L   AST 21 0 - 37 U/L  ALT 20 0 - 53 U/L   Total Protein 6.9 6.0 - 8.3 g/dL   Albumin 4.3 3.5 - 5.2 g/dL  Basic metabolic panel  Result Value Ref Range   Sodium 141 135 - 145 mEq/L   Potassium 4.5 3.5 - 5.1 mEq/L   Chloride 104 96 - 112 mEq/L   CO2 33 (H) 19 - 32 mEq/L   Glucose, Bld 101 (H) 70 - 99 mg/dL   BUN 21 6 - 23 mg/dL   Creatinine, Ser 7.51 0.40 - 1.50 mg/dL   GFR 70.01 >74.94 mL/min   Calcium 9.3 8.4 - 10.5 mg/dL  CBC with Differential/Platelet  Result Value Ref Range   WBC 7.8 4.0 - 10.5 K/uL   RBC 4.59 4.22 - 5.81 Mil/uL   Hemoglobin 13.7 13.0 - 17.0 g/dL   HCT 49.6 75.9 - 16.3 %   MCV 90.0 78.0 - 100.0 fl   MCHC 33.1 30.0 - 36.0 g/dL   RDW 84.6 65.9 - 93.5 %   Platelets 211.0 150.0 - 400.0 K/uL   Neutrophils Relative % 63.0 43.0 - 77.0 %   Lymphocytes Relative 24.6 12.0 - 46.0 %   Monocytes Relative 9.1 3.0 - 12.0 %   Eosinophils Relative 2.9 0.0 - 5.0 %   Basophils Relative 0.4 0.0 - 3.0 %   Neutro Abs 4.9 1.4 - 7.7 K/uL   Lymphs Abs 1.9 0.7 - 4.0 K/uL   Monocytes Absolute 0.7 0.1 - 1.0 K/uL   Eosinophils Absolute 0.2 0.0 - 0.7 K/uL   Basophils Absolute 0.0 0.0 - 0.1 K/uL  Hemoglobin  A1c  Result Value Ref Range   Hgb A1c MFr Bld 6.2 4.6 - 6.5 %  PSA, Medicare  Result Value Ref Range   PSA 3.13 0.10 - 4.00 ng/ml    Assessment and Plan:     ICD-10-CM   1. Healthcare maintenance  Z00.00 Pneumococcal polysaccharide vaccine 23-valent greater than or equal to 2yo subcutaneous/IM    2. Need for 23-polyvalent pneumococcal polysaccharide vaccine  Z23 Pneumococcal polysaccharide vaccine 23-valent greater than or equal to 2yo subcutaneous/IM     He is really doing okay otherwise.  Blood work is stable.  Other than his Pneumovax, no additional long-term health maintenance is needed.  I encouraged him about doing some counseling, and he is upset some today.  Health Maintenance Exam: The patient's preventative maintenance and recommended screening tests for an annual wellness exam were reviewed in full today. Brought up to date unless services declined.  Counselled on the importance of diet, exercise, and its role in overall health and mortality. The patient's FH and SH was reviewed, including their home life, tobacco status, and drug and alcohol status.  Follow-up in 1 year for physical exam or additional follow-up below.  I have personally reviewed the Medicare Annual Wellness questionnaire and have noted 1. The patient's medical and social history 2. Their use of alcohol, tobacco or illicit drugs 3. Their current medications and supplements 4. The patient's functional ability including ADL's, fall risks, home safety risks and hearing or visual             impairment. 5. Diet and physical activities 6. Evidence for depression or mood disorders 7. Reviewed Updated provider list, see scanned forms and [facility] Snapshot.  8. Reviewed whether or not the patient has HCPOA or living will, and discussed what this means with the patient.  Recommended he bring in a copy for his chart in [facility].  The patients weight, height, BMI  and visual acuity have been recorded in the chart I have  made referrals, counseling and provided education to the patient based review of the above and I have provided the pt with a written personalized care plan for preventive services.  I have provided the patient with a copy of your personalized plan for preventive services. Instructed to take the time to review along with their updated medication list.  Follow-up: No follow-ups on file. Or follow-up in 1 year if not noted.  No future appointments.   No orders of the defined types were placed in this encounter.  There are no discontinued medications. Orders Placed This Encounter  Procedures   Pneumococcal polysaccharide vaccine 23-valent greater than or equal to 2yo subcutaneous/IM    Signed,  Canton Yearby T. Floraine Buechler, MD   Allergies as of 05/23/2021       Reactions   Cefaclor    REACTION: unspecified        Medication List        Accurate as of May 23, 2021 10:01 AM. If you have any questions, ask your nurse or doctor.          albuterol 108 (90 Base) MCG/ACT inhaler Commonly known as: ProAir HFA INHALE 2 PUFFS INTO THE LUNGS EVERY 6 HOURS AS NEEDED FOR WHEEZING.   aspirin 81 MG tablet Take 81 mg by mouth daily.   atorvastatin 40 MG tablet Commonly known as: LIPITOR TAKE 1 TABLET BY MOUTH DAILY   Cinnamon 500 MG capsule Take 500 mg by mouth daily.   fish oil-omega-3 fatty acids 1000 MG capsule Take 1 g by mouth daily.   fluticasone 50 MCG/ACT nasal spray Commonly known as: FLONASE Place 2 sprays into the nose daily as needed.   GLUCOSAMINE 1500 COMPLEX PO Take by mouth.   hydrochlorothiazide 12.5 MG tablet Commonly known as: HYDRODIURIL TAKE 1 TABLET(12.5 MG) BY MOUTH DAILY   losartan 100 MG tablet Commonly known as: COZAAR TAKE 1 TABLET(100 MG) BY MOUTH DAILY   multivitamin tablet Take 1 tablet by mouth daily.   omeprazole 20 MG capsule Commonly known as: PRILOSEC TAKE 2 CAPSULES BY MOUTH DAILY   tamsulosin 0.4 MG Caps capsule Commonly  known as: FLOMAX TAKE 1 CAPSULE(0.4 MG) BY MOUTH DAILY   Turmeric 450 MG Caps Take 1 tablet by mouth daily.

## 2021-05-23 ENCOUNTER — Encounter: Payer: Self-pay | Admitting: Family Medicine

## 2021-05-23 ENCOUNTER — Ambulatory Visit (INDEPENDENT_AMBULATORY_CARE_PROVIDER_SITE_OTHER): Payer: Medicare Other | Admitting: Family Medicine

## 2021-05-23 ENCOUNTER — Other Ambulatory Visit: Payer: Self-pay

## 2021-05-23 VITALS — BP 146/90 | HR 78 | Temp 98.0°F | Ht 71.5 in | Wt 245.4 lb

## 2021-05-23 DIAGNOSIS — Z23 Encounter for immunization: Secondary | ICD-10-CM | POA: Diagnosis not present

## 2021-05-23 DIAGNOSIS — Z0000 Encounter for general adult medical examination without abnormal findings: Secondary | ICD-10-CM | POA: Diagnosis not present

## 2021-08-13 ENCOUNTER — Other Ambulatory Visit: Payer: Self-pay | Admitting: Family Medicine

## 2021-08-17 ENCOUNTER — Other Ambulatory Visit: Payer: Self-pay | Admitting: Family Medicine

## 2021-08-23 ENCOUNTER — Telehealth: Payer: Self-pay | Admitting: Family Medicine

## 2021-08-23 NOTE — Telephone Encounter (Signed)
Left message for patient to call back and schedule Medicare Annual Wellness Visit (AWV) either virtually or phone ? ? ?Last AWV 02/13/20 ?; please schedule at anytime with health coach ? ? ?

## 2021-09-19 ENCOUNTER — Ambulatory Visit: Payer: Medicare Other

## 2021-09-19 VITALS — Wt 245.0 lb

## 2021-09-19 DIAGNOSIS — Z1211 Encounter for screening for malignant neoplasm of colon: Secondary | ICD-10-CM | POA: Diagnosis not present

## 2021-09-19 DIAGNOSIS — Z0000 Encounter for general adult medical examination without abnormal findings: Secondary | ICD-10-CM | POA: Diagnosis not present

## 2021-09-19 NOTE — Patient Instructions (Signed)
Mr. Peele , ?Thank you for taking time to come for your Medicare Wellness Visit. I appreciate your ongoing commitment to your health goals. Please review the following plan we discussed and let me know if I can assist you in the future.  ? ?Screening recommendations/referrals: ?Colonoscopy: 03/06/17 ?Recommended yearly ophthalmology/optometry visit for glaucoma screening and checkup ?Recommended yearly dental visit for hygiene and checkup ? ?Vaccinations: ?Influenza vaccine: 01/27/21 ?Pneumococcal vaccine: 05/23/21 ?Tdap vaccine: 07/11/12 ?Shingles vaccine: Shingrix 01/23/19, 04/04/19   ?Covid-19: 05/27/20, 06/18/19, 02/10/20, 01/27/21 ? ?Advanced directives: yes, copy requested ? ?Conditions/risks identified: none ? ?Next appointment: Follow up in one year for your annual wellness visit. 09/21/22 @ 8:15am by phone ? ?Preventive Care 68 Years and Older, Male ?Preventive care refers to lifestyle choices and visits with your health care provider that can promote health and wellness. ?What does preventive care include? ?A yearly physical exam. This is also called an annual well check. ?Dental exams once or twice a year. ?Routine eye exams. Ask your health care provider how often you should have your eyes checked. ?Personal lifestyle choices, including: ?Daily care of your teeth and gums. ?Regular physical activity. ?Eating a healthy diet. ?Avoiding tobacco and drug use. ?Limiting alcohol use. ?Practicing safe sex. ?Taking low doses of aspirin every day. ?Taking vitamin and mineral supplements as recommended by your health care provider. ?What happens during an annual well check? ?The services and screenings done by your health care provider during your annual well check will depend on your age, overall health, lifestyle risk factors, and family history of disease. ?Counseling  ?Your health care provider may ask you questions about your: ?Alcohol use. ?Tobacco use. ?Drug use. ?Emotional well-being. ?Home and relationship  well-being. ?Sexual activity. ?Eating habits. ?History of falls. ?Memory and ability to understand (cognition). ?Work and work Statistician. ?Screening  ?You may have the following tests or measurements: ?Height, weight, and BMI. ?Blood pressure. ?Lipid and cholesterol levels. These may be checked every 5 years, or more frequently if you are over 62 years old. ?Skin check. ?Lung cancer screening. You may have this screening every year starting at age 84 if you have a 30-pack-year history of smoking and currently smoke or have quit within the past 15 years. ?Fecal occult blood test (FOBT) of the stool. You may have this test every year starting at age 16. ?Flexible sigmoidoscopy or colonoscopy. You may have a sigmoidoscopy every 5 years or a colonoscopy every 10 years starting at age 43. ?Prostate cancer screening. Recommendations will vary depending on your family history and other risks. ?Hepatitis C blood test. ?Hepatitis B blood test. ?Sexually transmitted disease (STD) testing. ?Diabetes screening. This is done by checking your blood sugar (glucose) after you have not eaten for a while (fasting). You may have this done every 1-3 years. ?Abdominal aortic aneurysm (AAA) screening. You may need this if you are a current or former smoker. ?Osteoporosis. You may be screened starting at age 36 if you are at high risk. ?Talk with your health care provider about your test results, treatment options, and if necessary, the need for more tests. ?Vaccines  ?Your health care provider may recommend certain vaccines, such as: ?Influenza vaccine. This is recommended every year. ?Tetanus, diphtheria, and acellular pertussis (Tdap, Td) vaccine. You may need a Td booster every 10 years. ?Zoster vaccine. You may need this after age 80. ?Pneumococcal 13-valent conjugate (PCV13) vaccine. One dose is recommended after age 48. ?Pneumococcal polysaccharide (PPSV23) vaccine. One dose is recommended after age 62. ?Talk  to your health care  provider about which screenings and vaccines you need and how often you need them. ?This information is not intended to replace advice given to you by your health care provider. Make sure you discuss any questions you have with your health care provider. ?Document Released: 05/21/2015 Document Revised: 01/12/2016 Document Reviewed: 02/23/2015 ?Elsevier Interactive Patient Education ? 2017 [place]. ? ?Fall Prevention in the Home ?Falls can cause injuries. They can happen to people of all ages. There are many things you can do to make your home safe and to help prevent falls. ?What can I do on the outside of my home? ?Regularly fix the edges of walkways and driveways and fix any cracks. ?Remove anything that might make you trip as you walk through a door, such as a raised step or threshold. ?Trim any bushes or trees on the path to your home. ?Use bright outdoor lighting. ?Clear any walking paths of anything that might make someone trip, such as rocks or tools. ?Regularly check to see if handrails are loose or broken. Make sure that both sides of any steps have handrails. ?Any raised decks and porches should have guardrails on the edges. ?Have any leaves, snow, or ice cleared regularly. ?Use sand or salt on walking paths during winter. ?Clean up any spills in your garage right away. This includes oil or grease spills. ?What can I do in the bathroom? ?Use night lights. ?Install grab bars by the toilet and in the tub and shower. Do not use towel bars as grab bars. ?Use non-skid mats or decals in the tub or shower. ?If you need to sit down in the shower, use a plastic, non-slip stool. ?Keep the floor dry. Clean up any water that spills on the floor as soon as it happens. ?Remove soap buildup in the tub or shower regularly. ?Attach bath mats securely with double-sided non-slip rug tape. ?Do not have throw rugs and other things on the floor that can make you trip. ?What can I do in the bedroom? ?Use night lights. ?Make  sure that you have a light by your bed that is easy to reach. ?Do not use any sheets or blankets that are too big for your bed. They should not hang down onto the floor. ?Have a firm chair that has side arms. You can use this for support while you get dressed. ?Do not have throw rugs and other things on the floor that can make you trip. ?What can I do in the kitchen? ?Clean up any spills right away. ?Avoid walking on wet floors. ?Keep items that you use a lot in easy-to-reach places. ?If you need to reach something above you, use a strong step stool that has a grab bar. ?Keep electrical cords out of the way. ?Do not use floor polish or wax that makes floors slippery. If you must use wax, use non-skid floor wax. ?Do not have throw rugs and other things on the floor that can make you trip. ?What can I do with my stairs? ?Do not leave any items on the stairs. ?Make sure that there are handrails on both sides of the stairs and use them. Fix handrails that are broken or loose. Make sure that handrails are as long as the stairways. ?Check any carpeting to make sure that it is firmly attached to the stairs. Fix any carpet that is loose or worn. ?Avoid having throw rugs at the top or bottom of the stairs. If you do have throw rugs,  attach them to the floor with carpet tape. ?Make sure that you have a light switch at the top of the stairs and the bottom of the stairs. If you do not have them, ask someone to add them for you. ?What else can I do to help prevent falls? ?Wear shoes that: ?Do not have high heels. ?Have rubber bottoms. ?Are comfortable and fit you well. ?Are closed at the toe. Do not wear sandals. ?If you use a stepladder: ?Make sure that it is fully opened. Do not climb a closed stepladder. ?Make sure that both sides of the stepladder are locked into place. ?Ask someone to hold it for you, if possible. ?Clearly mark and make sure that you can see: ?Any grab bars or handrails. ?First and last steps. ?Where the  edge of each step is. ?Use tools that help you move around (mobility aids) if they are needed. These include: ?Canes. ?Walkers. ?Scooters. ?Crutches. ?Turn on the lights when you go into a dark area. Replace

## 2021-09-19 NOTE — Progress Notes (Signed)
?Virtual Visit via Telephone Note ? ?I connected with  James Watts on 09/19/21 at  8:15 AM EDT by telephone and verified that I am speaking with the correct person using two identifiers. ? ?Location: ?Patient: home ?Provider: [facility] ?Persons participating in the virtual visit: patient/Nurse Health Advisor ?  ?I discussed the limitations, risks, security and privacy concerns of performing an evaluation and management service by telephone and the availability of in person appointments. The patient expressed understanding and agreed to proceed. ? ?Interactive audio and video telecommunications were attempted between this nurse and patient, however failed, due to patient having technical difficulties OR patient did not have access to video capability.  We continued and completed visit with audio only. ? ?Some vital signs may be absent or patient reported.  ? ?Hal Hope, LPN ? ?Subjective:  ? James Watts is a 66 y.o. male who presents for Medicare Annual/Subsequent preventive examination. ? ?Review of Systems    ? ?  ? ?   ?Objective:  ?  ?There were no vitals filed for this visit. ?There is no height or weight on file to calculate BMI. ? ? ?  09/10/2014  ?  6:25 AM  ?Advanced Directives  ?Does Patient Have a Medical Advance Directive? No;Yes  ?Type of Estate agent of [place];Living will  ?Does patient want to make changes to medical advance directive? No - Patient declined  ?Copy of Healthcare Power of Attorney in Chart? No - copy requested  ?Would patient like information on creating a medical advance directive? No - patient declined information  ? ? ?Current Medications (verified) ?Outpatient Encounter Medications as of 09/19/2021  ?Medication Sig  ? albuterol (PROAIR HFA) 108 (90 Base) MCG/ACT inhaler INHALE 2 PUFFS INTO THE LUNGS EVERY 6 HOURS AS NEEDED FOR WHEEZING.  ? aspirin 81 MG tablet Take 81 mg by mouth daily.  ? atorvastatin (LIPITOR) 40 MG tablet TAKE 1 TABLET BY  MOUTH DAILY  ? Cinnamon 500 MG capsule Take 500 mg by mouth daily.  ? fish oil-omega-3 fatty acids 1000 MG capsule Take 1 g by mouth daily.  ? fluticasone (FLONASE) 50 MCG/ACT nasal spray Place 2 sprays into the nose daily as needed.  ? Glucosamine-Chondroit-Vit C-Mn (GLUCOSAMINE 1500 COMPLEX PO) Take by mouth.  ? hydrochlorothiazide (HYDRODIURIL) 12.5 MG tablet TAKE 1 TABLET(12.5 MG) BY MOUTH DAILY  ? losartan (COZAAR) 100 MG tablet TAKE 1 TABLET(100 MG) BY MOUTH DAILY  ? Multiple Vitamin (MULTIVITAMIN) tablet Take 1 tablet by mouth daily.  ? omeprazole (PRILOSEC) 20 MG capsule TAKE 2 CAPSULES BY MOUTH DAILY  ? tamsulosin (FLOMAX) 0.4 MG CAPS capsule TAKE 1 CAPSULE(0.4 MG) BY MOUTH DAILY  ? Turmeric 450 MG CAPS Take 1 tablet by mouth daily.  ? ?No facility-administered encounter medications on file as of 09/19/2021.  ? ? ?Allergies (verified) ?Cefaclor  ? ?History: ?Past Medical History:  ?Diagnosis Date  ? GERD (gastroesophageal reflux disease) 02/25/2007  ? Qualifier: Diagnosis of  By: Deirdre Priest MD, Gaynell Face    ? 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 HYPERLIPIDEMIA 02/25/2007  ? Qualifier: Diagnosis of  By: Deirdre Priest MD, Gaynell Face    ? HYPERTENSION 02/25/2007  ? Qualifier: Diagnosis of  By: Deirdre Priest MD, Gaynell Face    ? ?Past Surgical History:  ?Procedure Laterality Date  ? JOINT REPLACEMENT Left 11/09/2020  ? Total Hip Replacement  ? TONSILLECTOMY    ? TOTAL ANKLE ARTHROPLASTY Right 2016  ? [facility]  ? ?No family history on file. ?Social  History  ? ?Socioeconomic History  ? Marital status: Married  ?  Spouse name: Not on file  ? Number of children: Not on file  ? Years of education: Not on file  ? Highest education level: Not on file  ?Occupational History  ? Not on file  ?Tobacco Use  ? Smoking status: Never  ? Smokeless tobacco: Never  ?Substance and Sexual Activity  ? Alcohol use: Yes  ?  Alcohol/week: 0.0 standard drinks  ?  Comment: occassional  ? Drug use: No  ? Sexual activity: Not on file   ?Other Topics Concern  ? Not on file  ?Social History Narrative  ? Not on file  ? ?Social Determinants of Health  ? ?Financial Resource Strain: Not on file  ?Food Insecurity: Not on file  ?Transportation Needs: Not on file  ?Physical Activity: Not on file  ?Stress: Not on file  ?Social Connections: Not on file  ? ? ?Tobacco Counseling ?Counseling given: Not Answered ? ? ?Clinical Intake: ? ?Pre-visit preparation completed: Yes ? ?Pain : No/denies pain ? ?  ? ?Nutritional Risks: None ?Diabetes: No ? ?How often do you need to have someone help you when you read instructions, pamphlets, or other written materials from your doctor or pharmacy?: 1 - Never ? ?Diabetic?no ? ?Interpreter Needed?: No ? ?Information entered by :: Kennedy Bucker, LPN ? ? ?Activities of Daily Living ?   ? View : No data to display.  ?  ?  ?  ? ? ?Patient Care Team: ?Hannah Beat, MD as PCP - General (Family Medicine) ? ?Indicate any recent Medical Services you may have received from other than [facility] providers in the past year (date may be approximate). ? ?   ?Assessment:  ? This is a routine wellness examination for James Watts. ? ?Hearing/Vision screen ?No results found. ? ?Dietary issues and exercise activities discussed: ?  ? ? Goals Addressed   ?None ?  ? ?Depression Screen ? ?  05/23/2021  ?  9:00 AM 02/13/2020  ?  3:46 PM 12/25/2018  ?  9:41 AM 08/29/2017  ?  9:07 AM 04/09/2013  ?  8:34 AM  ?PHQ 2/9 Scores  ?PHQ - 2 Score 0 0 0 0 0  ?PHQ- 9 Score  2     ?  ?Fall Risk ? ?  05/23/2021  ?  9:00 AM 02/13/2020  ?  3:46 PM 12/25/2018  ?  9:41 AM 04/09/2013  ?  8:34 AM  ?Fall Risk   ?Falls in the past year? 0 0 0 No  ?Number falls in past yr:  0 0   ?Injury with Fall?  0 0   ? ? ?FALL RISK PREVENTION PERTAINING TO THE HOME: ? ?Any stairs in or around the home? No  ?If so, are there any without handrails? No  ?Home free of loose throw rugs in walkways, pet beds, electrical cords, etc? Yes  ?Adequate lighting in your home to reduce risk of falls? Yes   ? ?ASSISTIVE DEVICES UTILIZED TO PREVENT FALLS: ? ?Life alert? No  ?Use of a cane, walker or w/c? No  ?Grab bars in the bathroom? No  ?Shower chair or bench in shower? No  ?Elevated toilet seat or a handicapped toilet? Yes  ? ?Cognitive Function: ? 0 points 6CIT ?  ?  ? ?Immunizations ?Immunization History  ?Administered Date(s) Administered  ? Influenza Split 03/22/2011, 02/13/2012  ? Influenza Whole 02/25/2007, 03/03/2008, 03/22/2009, 04/06/2010  ? Influenza, High Dose Seasonal PF 01/16/2019, 01/20/2020, 01/27/2021  ?  Influenza,inj,Quad PF,6+ Mos 02/27/2013, 03/06/2014, 02/10/2015, 02/25/2016, 02/21/2017, 01/29/2018  ? Moderna Covid-19 Vaccine Bivalent Booster 60yrs & up 01/27/2021  ? PFIZER(Purple Top)SARS-COV-2 Vaccination 05/28/2019, 06/18/2019, 02/10/2020  ? Pneumococcal Conjugate-13 03/27/2019  ? Pneumococcal Polysaccharide-23 05/23/2021  ? Td 03/22/2009  ? Tdap 07/11/2012  ? Zoster Recombinat (Shingrix) 01/23/2019, 04/04/2019  ? Zoster, Live 03/17/2015  ? ? ?TDAP status: Up to date ? ?Flu Vaccine status: Up to date ? ?Pneumococcal vaccine status: Up to date ? ?Covid-19 vaccine status: Completed vaccines ? ?Qualifies for Shingles Vaccine? Yes   ?Zostavax completed Yes   ?Shingrix Completed?: Yes ? ?Screening Tests ?Health Maintenance  ?Topic Date Due  ? INFLUENZA VACCINE  12/06/2021  ? TETANUS/TDAP  07/12/2022  ? COLONOSCOPY (Pts 45-93yrs Insurance coverage will need to be confirmed)  03/07/2027  ? Pneumonia Vaccine 56+ Years old  Completed  ? COVID-19 Vaccine  Completed  ? Hepatitis C Screening  Completed  ? Zoster Vaccines- Shingrix  Completed  ? HPV VACCINES  Aged Out  ? ? ?Health Maintenance ? ?There are no preventive care reminders to display for this patient. ? ?Colorectal cancer screening: Type of screening: Colonoscopy. Completed 03/06/17. Repeat every 10 years- pt prefers to have another colonoscopy, referral sent ? ?Lung Cancer Screening: (Low Dose CT Chest recommended if Age 21-80 years, 30  pack-year currently smoking OR have quit w/in 15years.) does not qualify.  ? ? ?Additional Screening: ? ?Hepatitis C Screening: does qualify; Completed 04/09/13 ? ?Vision Screening: Recommended annual ophthalmolog

## 2021-09-20 ENCOUNTER — Encounter: Payer: Self-pay | Admitting: Family Medicine

## 2021-09-20 DIAGNOSIS — N529 Male erectile dysfunction, unspecified: Secondary | ICD-10-CM

## 2021-09-21 ENCOUNTER — Telehealth: Payer: Self-pay

## 2021-09-21 DIAGNOSIS — Z1211 Encounter for screening for malignant neoplasm of colon: Secondary | ICD-10-CM

## 2021-09-21 NOTE — Telephone Encounter (Signed)
Pt had Wellness Appt with Nurse and she did a referral for a colonoscopy at [facility]. Pt would rather have it done at [facility]. Asking if Dr Patsy Lager can create a new referral. ?

## 2021-09-21 NOTE — Telephone Encounter (Signed)
ICD-10-CM   ?1. Screening for malignant neoplasm of colon  Z12.11 Ambulatory referral to Gastroenterology  ?  ? ?

## 2021-10-10 ENCOUNTER — Encounter: Payer: Self-pay | Admitting: *Deleted

## 2021-10-28 ENCOUNTER — Encounter: Payer: Self-pay | Admitting: Family Medicine

## 2021-10-28 DIAGNOSIS — L918 Other hypertrophic disorders of the skin: Secondary | ICD-10-CM

## 2021-10-31 NOTE — Telephone Encounter (Signed)
Optho referral  He never heard anything about his colonoscopy, but I see that we did send it to [facility].  ? Can you help.

## 2021-11-03 NOTE — Telephone Encounter (Signed)
Noted.   Spoke with patient  Referral updated. Referral sent to [facility]. See referral notes  Nothing further needed.

## 2021-11-05 IMAGING — DX DG HIP (WITH OR WITHOUT PELVIS) 2-3V*L*
3 series · 3 of 3 positions shown · non-contrast
Comparison: None.

CLINICAL DATA: Left hip pain for 4 months

EXAM:
DG HIP (WITH OR WITHOUT PELVIS) 2-3V LEFT

[pelvis ap]
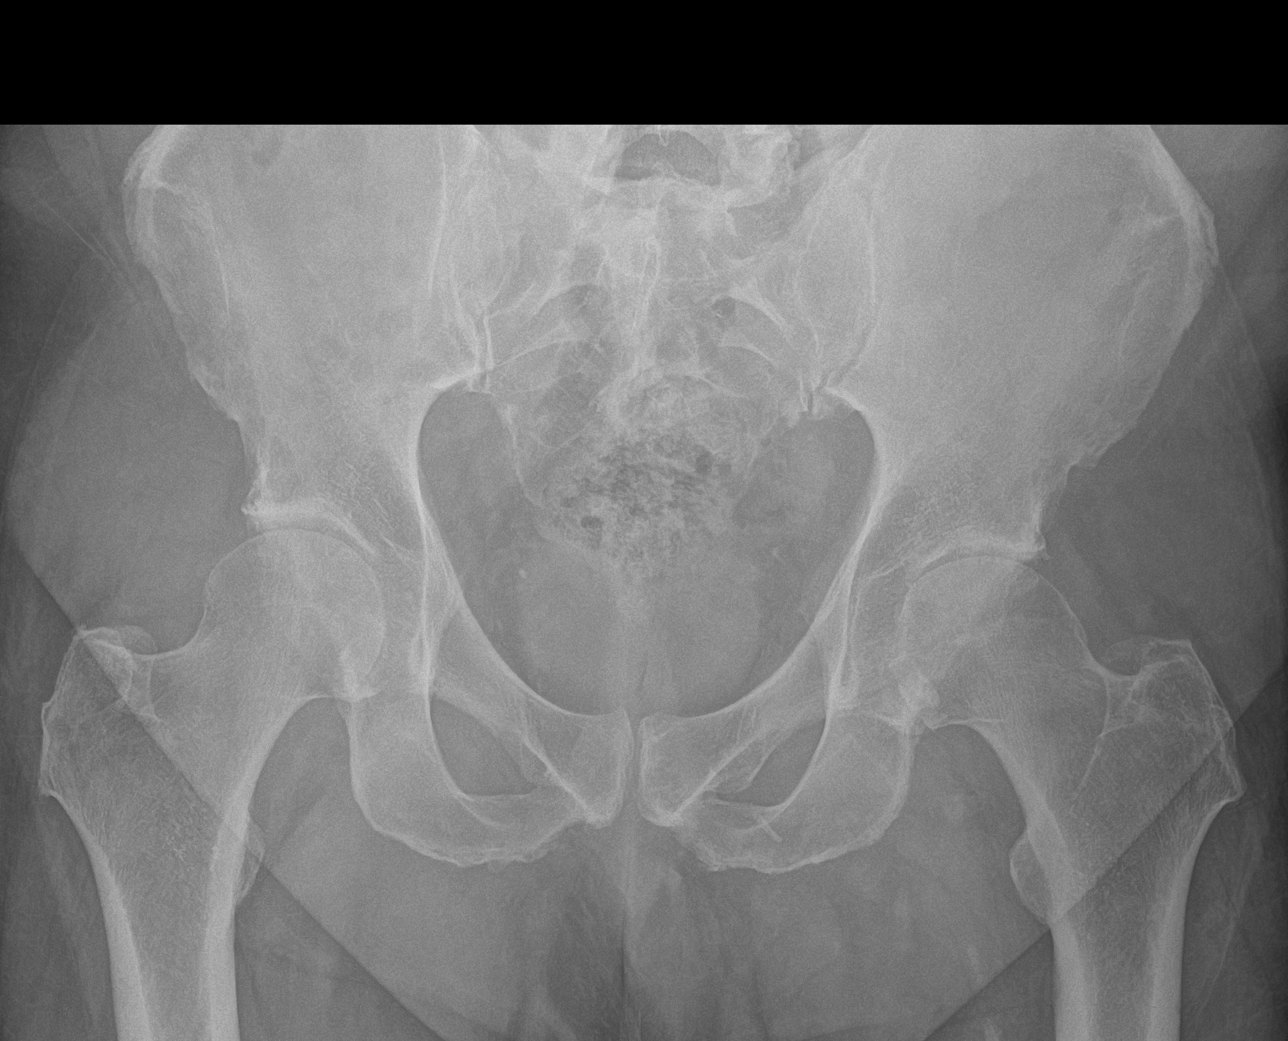

[hip ap]
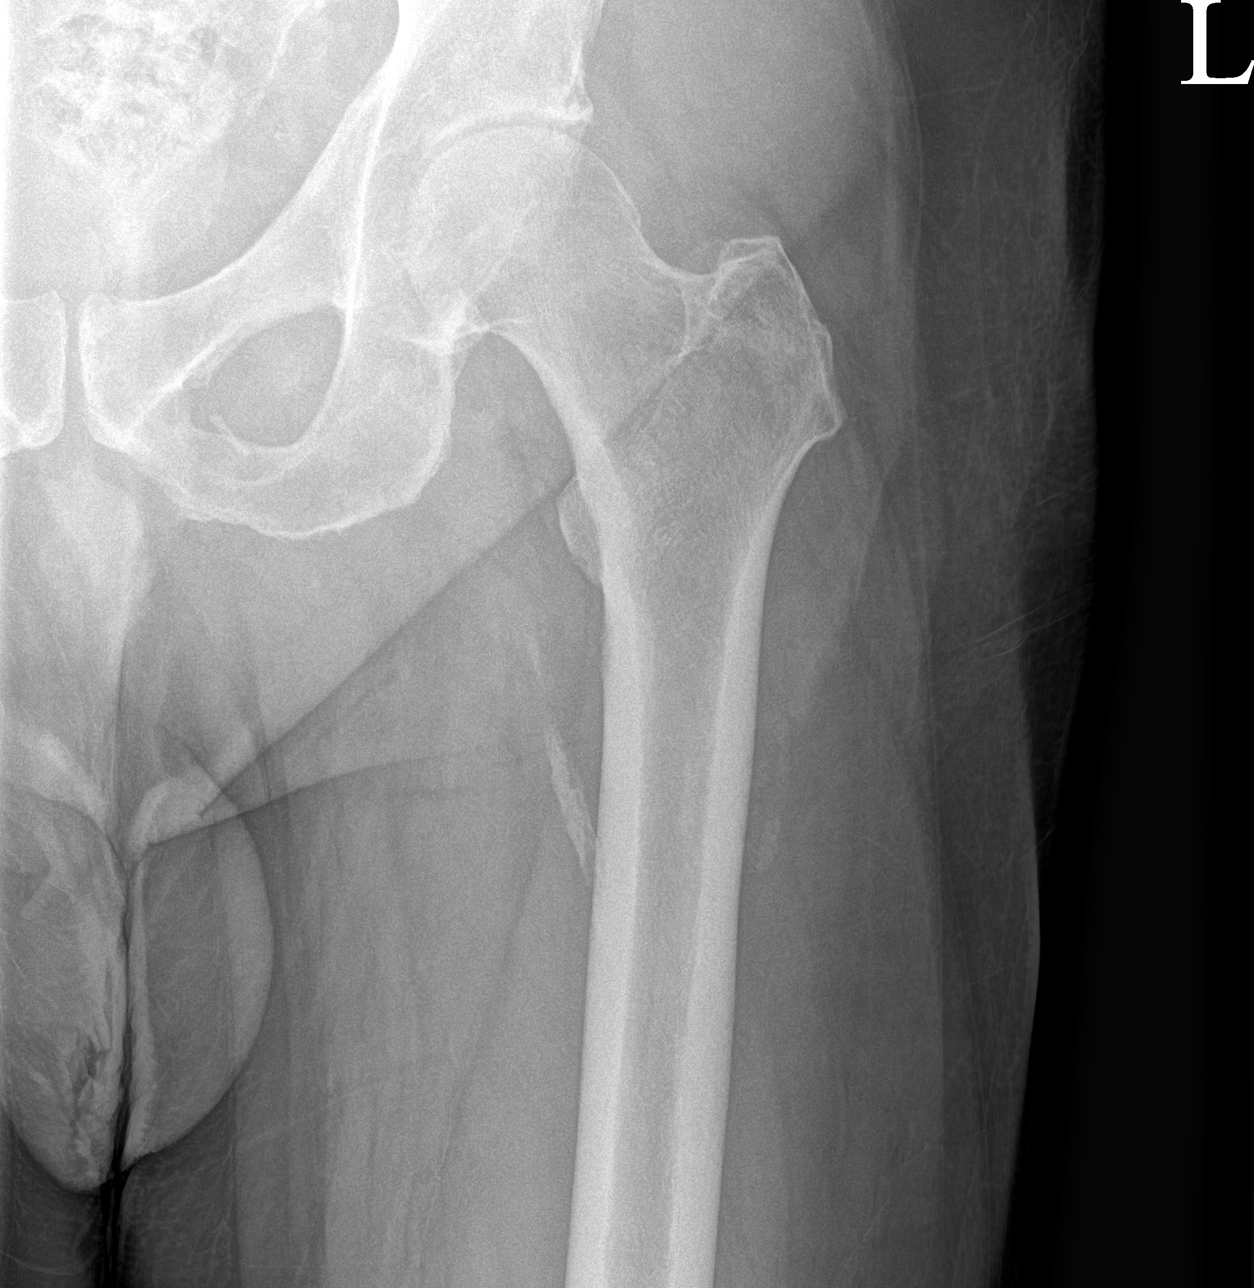

[hip frog leg]
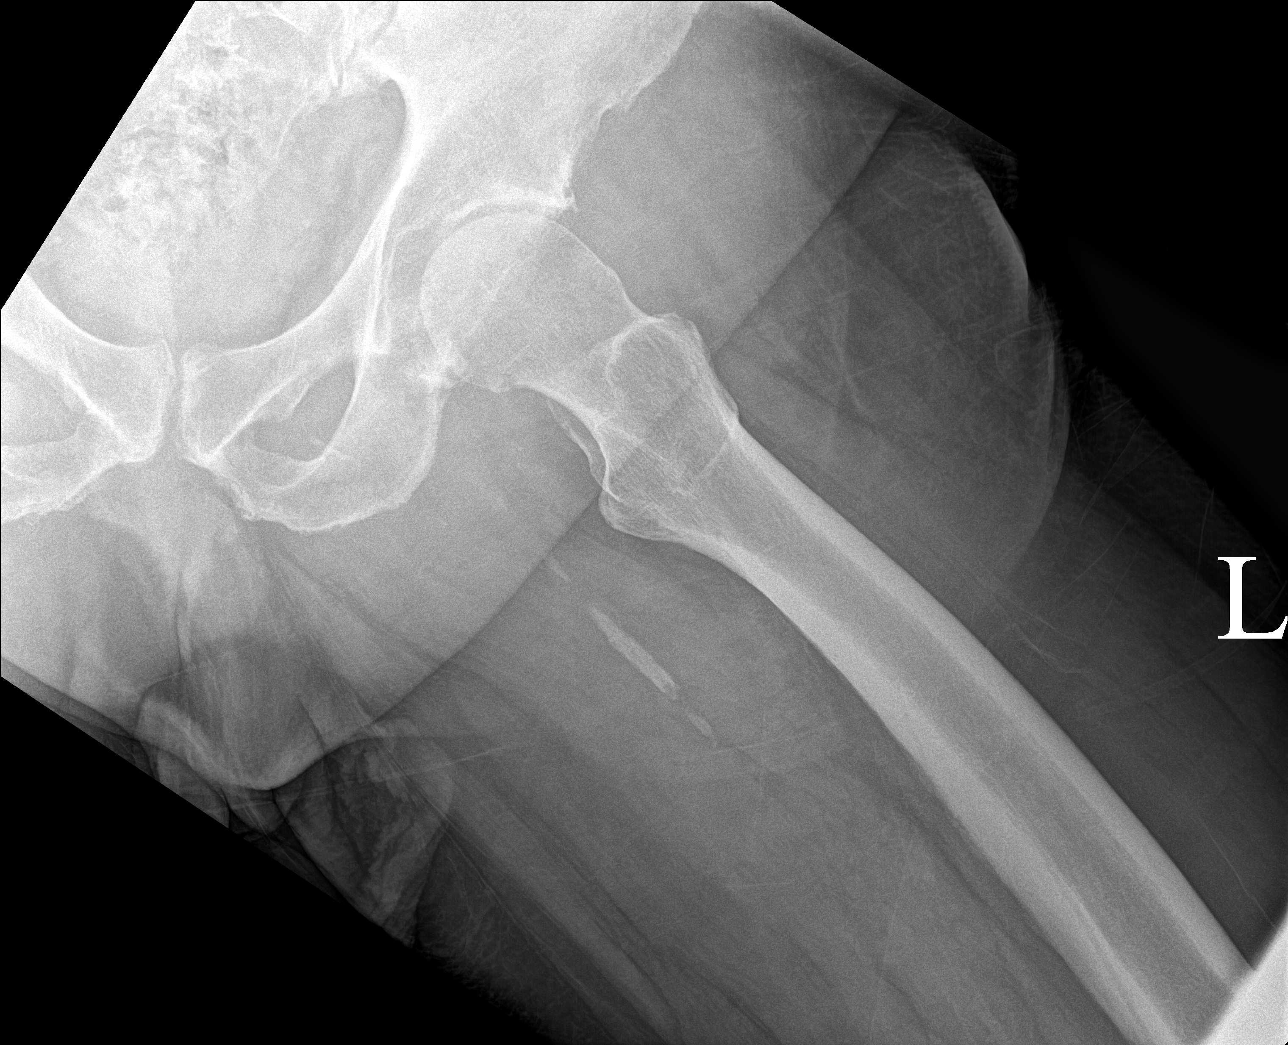

[3 of 3 positions shown; findings below may reference images not displayed]

FINDINGS: No fracture or dislocation of the left hip. There is moderate
superior joint space height loss and osteophytosis of the left hip,
which is asymmetric compared to the right. Vascular calcinosis.
IMPRESSION: No fracture or dislocation of the left hip. Moderate left hip
arthrosis.

## 2021-11-25 ENCOUNTER — Telehealth: Payer: Self-pay | Admitting: Family Medicine

## 2021-11-25 NOTE — Telephone Encounter (Signed)
Misty from [facility] called in stating they need Rick's last lab results and physical. This can be faxed over to [phone]. Thank you!

## 2021-11-25 NOTE — Telephone Encounter (Signed)
Office note (CPE) and labs faxed to [facility] as requested.

## 2021-12-27 LAB — HM COLONOSCOPY

## 2022-01-03 ENCOUNTER — Encounter: Payer: Self-pay | Admitting: Family Medicine

## 2022-02-09 ENCOUNTER — Telehealth: Payer: Self-pay

## 2022-02-09 NOTE — Telephone Encounter (Signed)
[name] - Client Nonclinical Telephone Record  [facility] Client [facility] Night - Client Client Site [place] - Night Contact Type Call Who Is Calling Patient / Member / Family / Caregiver Caller Name Doyle Phone Number [phone] Patient Name James Watts Patient DOB [birth year] Call Type Message Only Information Provided Reason for Call Billing Question Initial Comment Caller states that he has some billing questions. He has an annual physical last January. In May he received a call regarding a virtual wellness interview. He was charged $295 dollars. He is wondering why he is being charged for this. Additional Comment Office hours provided. He would like a call around 10am. He is in . Disp. Time Disposition Final User 02/08/2022 6:02:35 PM General Information Provided Yes [place], [place] Call Closed By: Mitchell Heir Transaction Date/Time: 02/08/2022 5:57:47 PM (ET

## 2022-02-13 NOTE — Telephone Encounter (Signed)
Called patient and go him over to billing for his questions.

## 2022-02-14 ENCOUNTER — Telehealth: Payer: Self-pay | Admitting: Family Medicine

## 2022-02-14 NOTE — Telephone Encounter (Signed)
Forwarded pt concerns to [facility] and she replied: [phone]([phone])  Please void dos 09/19/2021 , patient had AWV by provider on 05/23/2021 , 09/20/2021 should not have been billed.  Please remove from Bad debt.    Spoke with pt and advised charge resolved. Pt understood and was glad.

## 2022-02-14 NOTE — Telephone Encounter (Signed)
Pt called stating he had his cpe done 05/23/21 with [facility] & 4 months later, he had his Medicare AWV Telephone call done with Margarita Grizzle on 09/19/21. He stated he asked Margarita Grizzle was it required saying that the telephone call was 4 months from his cpe, Margarita Grizzle stated it was required. The pt stated he was then charged $295.00 for the telephone call & stated he didn't know that he would be charged that or else he wouldn't had completed the call. Pt stated he already contacted billing twice & has went up the chain of commands to get the issue disputed, but it was told to him, it couldn't be disputed due to nothing being coded wrong or anything. Pt stated he was told by Billing to give [place] a call to handle the issue. Pt states he's not going to pay the charge because he wasn't the one who decided to schedule the call 4 months after his cpe & his insurance stated since the call wasn't done within the correct time frame (or something like that), they wouldn't cover the charge. Pt's wife has an appt tomorrow @ 10am & he said he was going to come in then to see what could be done. Call back # [phone]

## 2022-05-10 ENCOUNTER — Other Ambulatory Visit: Payer: Self-pay | Admitting: Family Medicine

## 2022-08-08 ENCOUNTER — Other Ambulatory Visit: Payer: Self-pay | Admitting: Family Medicine

## 2022-08-08 NOTE — Telephone Encounter (Signed)
LVM for patient to c/b and sched.  

## 2022-08-08 NOTE — Telephone Encounter (Signed)
Patient has been scheduled

## 2022-08-08 NOTE — Telephone Encounter (Signed)
Please call and schedule CPE with fasting labs prior with Dr. Lorelei Pont after 09/21/22.

## 2022-08-17 ENCOUNTER — Telehealth: Payer: Self-pay | Admitting: Family Medicine

## 2022-08-17 ENCOUNTER — Encounter: Payer: Self-pay | Admitting: Family Medicine

## 2022-08-17 DIAGNOSIS — C169 Malignant neoplasm of stomach, unspecified: Secondary | ICD-10-CM

## 2022-08-17 NOTE — Telephone Encounter (Signed)
Patient's wife contacted the office today, states she and the patient live part time in [place] as well as the states. Stated the patient was having some stomach issues and had an endoscopy done in [place], was told by a physician there that he has cancer. Patient has copies of the reports from the endoscopy as well as a tape with images. Patient was told by physician in [place] to see an oncologist [place] the [place], patient is requesting a referral to oncology as well as a call back ASAP to discuss what is going on and how to move forward with Dr. Patsy Lager. Please advise [phone].

## 2022-08-17 NOTE — Telephone Encounter (Signed)
I spoke to the patient and his wife regarding this information.  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    He will be back in the [place] in one week, and requests consultation with [facility] for treatment.  Thank-you to help with this coordination.   Call [phone].

## 2022-08-17 NOTE — Telephone Encounter (Signed)
error 

## 2022-08-18 ENCOUNTER — Encounter: Payer: Self-pay | Admitting: *Deleted

## 2022-08-18 DIAGNOSIS — C179 Malignant neoplasm of small intestine, unspecified: Secondary | ICD-10-CM | POA: Insufficient documentation

## 2022-08-18 HISTORY — DX: Malignant neoplasm of small intestine, unspecified: C17.9

## 2022-08-18 NOTE — Telephone Encounter (Signed)
Will keep an eye out for records in [place].

## 2022-08-18 NOTE — Telephone Encounter (Signed)
Do you have a fax number where he can send that information?  He is in [place] and unable to send anything through MyChart.  I am not sure how to get that information, but he does have all of the reports.

## 2022-08-18 NOTE — Telephone Encounter (Signed)
Do we have the copy of his visits and Endoscopy from [place]? I will need those in order to send the referral to [facility].

## 2022-08-18 NOTE — Telephone Encounter (Signed)
Per Dr Patsy Lager he gave the patient the fax number to the office and advised him to put the document to my attention so that I am made aware that they are received. They should be scanned into the patients chart when received also.   Will send to [facility] to keep an eye out for any records on this patient.

## 2022-08-18 NOTE — Telephone Encounter (Signed)
He is not able to receive a Mychart message in [place], but I have just given him our fax number at [phone] via telephone.   I have also given him the contact information for [facility], as well.

## 2022-08-21 ENCOUNTER — Other Ambulatory Visit: Payer: Self-pay | Admitting: Family Medicine

## 2022-08-21 DIAGNOSIS — E782 Mixed hyperlipidemia: Secondary | ICD-10-CM

## 2022-08-21 DIAGNOSIS — Z125 Encounter for screening for malignant neoplasm of prostate: Secondary | ICD-10-CM

## 2022-08-21 DIAGNOSIS — C269 Malignant neoplasm of ill-defined sites within the digestive system: Secondary | ICD-10-CM

## 2022-08-21 DIAGNOSIS — R7303 Prediabetes: Secondary | ICD-10-CM

## 2022-08-21 DIAGNOSIS — C169 Malignant neoplasm of stomach, unspecified: Secondary | ICD-10-CM

## 2022-08-21 DIAGNOSIS — Z79899 Other long term (current) drug therapy: Secondary | ICD-10-CM

## 2022-08-28 NOTE — Telephone Encounter (Signed)
See referral notes, patient already scheduled with Onc and they have his records.   Nothing further needed.

## 2022-08-31 ENCOUNTER — Other Ambulatory Visit (INDEPENDENT_AMBULATORY_CARE_PROVIDER_SITE_OTHER): Payer: Medicare Other

## 2022-08-31 DIAGNOSIS — E782 Mixed hyperlipidemia: Secondary | ICD-10-CM

## 2022-08-31 DIAGNOSIS — Z125 Encounter for screening for malignant neoplasm of prostate: Secondary | ICD-10-CM

## 2022-08-31 DIAGNOSIS — R7303 Prediabetes: Secondary | ICD-10-CM

## 2022-08-31 DIAGNOSIS — Z79899 Other long term (current) drug therapy: Secondary | ICD-10-CM

## 2022-08-31 DIAGNOSIS — C169 Malignant neoplasm of stomach, unspecified: Secondary | ICD-10-CM

## 2022-08-31 DIAGNOSIS — C269 Malignant neoplasm of ill-defined sites within the digestive system: Secondary | ICD-10-CM

## 2022-08-31 LAB — CBC WITH DIFFERENTIAL/PLATELET
Absolute Monocytes: 788 cells/uL (ref 200–950)
Eosinophils Absolute: 180 cells/uL (ref 15–500)
MCHC: 32 g/dL (ref 32.0–36.0)

## 2022-09-01 LAB — PSA: PSA: 3.82 ng/mL (ref <=4.00)

## 2022-09-01 LAB — CBC WITH DIFFERENTIAL/PLATELET
Lymphs Abs: 1823 cells/uL (ref 850–3900)
MCV: 86.6 fL (ref 80.0–100.0)
MPV: 10.7 fL (ref 7.5–12.5)

## 2022-09-04 ENCOUNTER — Encounter: Payer: Self-pay | Admitting: Family Medicine

## 2022-09-04 ENCOUNTER — Ambulatory Visit (INDEPENDENT_AMBULATORY_CARE_PROVIDER_SITE_OTHER): Payer: Medicare Other | Admitting: Family Medicine

## 2022-09-04 VITALS — BP 90/60 | HR 69 | Temp 98.0°F | Ht 71.0 in | Wt 231.5 lb

## 2022-09-04 DIAGNOSIS — E782 Mixed hyperlipidemia: Secondary | ICD-10-CM | POA: Diagnosis not present

## 2022-09-04 DIAGNOSIS — Z0000 Encounter for general adult medical examination without abnormal findings: Secondary | ICD-10-CM

## 2022-09-04 DIAGNOSIS — I10 Essential (primary) hypertension: Secondary | ICD-10-CM

## 2022-09-04 DIAGNOSIS — K219 Gastro-esophageal reflux disease without esophagitis: Secondary | ICD-10-CM

## 2022-09-04 DIAGNOSIS — K3189 Other diseases of stomach and duodenum: Secondary | ICD-10-CM

## 2022-09-04 DIAGNOSIS — R7401 Elevation of levels of liver transaminase levels: Secondary | ICD-10-CM

## 2022-09-04 DIAGNOSIS — N1831 Chronic kidney disease, stage 3a: Secondary | ICD-10-CM | POA: Diagnosis not present

## 2022-09-04 LAB — CBC WITH DIFFERENTIAL/PLATELET
Basophils Absolute: 38 cells/uL (ref 0–200)
HCT: 35 % — ABNORMAL LOW (ref 38.5–50.0)
RDW: 13.9 % (ref 11.0–15.0)

## 2022-09-04 LAB — CEA: CEA: 5.8 ng/mL — ABNORMAL HIGH

## 2022-09-04 NOTE — Progress Notes (Unsigned)
[name], MD, CAQ Sports Medicine [facility] at [facility] [address] [place], [address]  Phone: [phone]  FAX: [phone]  James Watts - 67 y.o. male  MRN [id number]  Date of Birth: [birth year]  Date: 09/04/2022  PCP: Hannah Beat, MD  Referral: Hannah Beat, MD  Chief Complaint  Patient presents with   Annual Exam    MWV 09/19/2021   Patient Care Team: Hannah Beat, MD as PCP - General (Family Medicine) Subjective:   James Watts is a 67 y.o. 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  He is already met with oncology at [facility].  Pathology is not available, done in [place].  He is obviously upset and distraught about this.  Health Maintenance Summary Reviewed and updated, unless pt declines services.  Tobacco History Reviewed. Alcohol: No concerns, no excessive use Exercise Habits: Some activity, rec at least 30 mins 5 times a week STD concerns: no risk or activity to increase risk Drug Use: None  F/u CKD3: Most recent GFR is 73, improved from prior chronic kidney disease. Lab Results  Component Value Date   NA 141 05/19/2021   K 4.5 05/19/2021   CO2 33 (H) 05/19/2021   GLUCOSE 101 (H) 05/19/2021   BUN 21 05/19/2021   CREATININE 1.17 05/19/2021   CALCIUM 9.3 05/19/2021     HTN: Tolerating all medications without side effects Stable and at goal No CP, no sob. No HA.  BP Readings from Last 3 Encounters:  09/04/22 90/60  05/23/21 (!) 146/90  08/30/20 120/72   Lipids: Doing well, stable. Tolerating meds fine with no SE. Panel reviewed with patient.  Lipids: Lab Results  Component Value Date   CHOL 161 05/19/2021   Lab Results  Component Value Date   HDL 45.60 05/19/2021   Lab Results  Component Value Date   LDLCALC 91 05/19/2021   Lab Results  Component Value Date   TRIG 122.0 05/19/2021   Lab Results   Component Value Date   CHOLHDL 4 05/19/2021    Lab Results  Component Value Date   ALT 20 05/19/2021   AST 21 05/19/2021   ALKPHOS 74 05/19/2021   BILITOT 0.9 05/19/2021      Tdap - had one in [place]  CT yesterday -  Endoscope tomorrow - think he may need a Whipple surgery Eating soft foods only  Wt Readings from Last 3 Encounters:  09/04/22 231 lb 8 oz (105 kg)  09/19/21 245 lb (111.1 kg)  05/23/21 245 lb 6 oz (111.3 kg)     Health Maintenance  Topic Date Due   COVID-19 Vaccine (5 - 2023-24 season) 01/06/2022   Medicare Annual Wellness (AWV)  09/20/2022   INFLUENZA VACCINE  12/07/2022   COLONOSCOPY (Pts 45-57yrs Insurance coverage will need to be confirmed)  12/27/2028   DTaP/Tdap/Td (4 - Td or Tdap) 08/03/2030   Pneumonia Vaccine 20+ Years old  Completed   Hepatitis C Screening  Completed   Zoster Vaccines- Shingrix  Completed   HPV VACCINES  Aged Out   Immunization History  Administered Date(s) Administered   Influenza Split 03/22/2011, 02/13/2012   Influenza Whole 02/25/2007, 03/03/2008, 03/22/2009, 04/06/2010   Influenza, High Dose Seasonal PF 01/16/2019, 01/20/2020, 01/27/2021   Influenza,inj,Quad PF,6+ Mos 02/27/2013, 03/06/2014, 02/10/2015, 02/25/2016, 02/21/2017, 01/29/2018   Moderna Covid-19 Vaccine Bivalent Booster 31yrs & up 01/27/2021   PFIZER(Purple Top)SARS-COV-2 Vaccination 05/28/2019, 06/18/2019, 02/10/2020  Pneumococcal Conjugate-13 03/27/2019   Pneumococcal Polysaccharide-23 05/23/2021   Td 03/22/2009   Tdap 07/11/2012, 08/02/2020   Zoster Recombinat (Shingrix) 01/23/2019, 04/04/2019   Zoster, Live 03/17/2015   Patient Active Problem List   Diagnosis Date Noted   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    Priority: High   CKD (chronic kidney disease) stage 3, GFR 30-59 ml/min (HCC) 02/11/2020    Priority: Medium    HYPERLIPIDEMIA 02/25/2007    Priority: Medium    Essential hypertension 02/25/2007    Priority: Medium    H/O total ankle replacement,  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   Benign prostatic hyperplasia with urinary hesitancy 03/22/2009   GERD (gastroesophageal reflux disease) 02/25/2007    Past Medical History:  Diagnosis Date   GERD (gastroesophageal reflux disease) 02/25/2007   Qualifier: Diagnosis of  By: Deirdre Priest MD, Gaynell Face     H/O total ankle replacement, 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   HYPERLIPIDEMIA 02/25/2007   Qualifier: Diagnosis of  By: Deirdre Priest MD, Gaynell Face     HYPERTENSION 02/25/2007   Qualifier: Diagnosis of  By: Deirdre Priest MD, Gaynell Face      Past Surgical History:  Procedure Laterality Date   JOINT REPLACEMENT Left 11/09/2020   Total Hip Replacement   TONSILLECTOMY     TOTAL ANKLE ARTHROPLASTY Right 2016   [facility]    No family history on file.  Social History   Social History Narrative   Not on file    Past Medical History, Surgical History, Social History, Family History, Problem List, Medications, and Allergies have been reviewed and updated if relevant.  Review of Systems: Pertinent positives are listed above.  Otherwise, a full 14 point review of systems has been done in full and it is negative except where it is noted positive.  Objective:   BP 90/60   Pulse 69   Temp 98 F (36.7 C) (Temporal)   Ht 5\' 11"  (1.803 m)   Wt 231 lb 8 oz (105 kg)   SpO2 99%   BMI 32.29 kg/m  Ideal Body Weight: Weight in (lb) to have BMI = 25: 178.9  Ideal Body Weight: Weight in (lb) to have BMI = 25: 178.9 No results found.    09/04/2022    3:54 PM 09/19/2021    8:21 AM 05/23/2021    9:00 AM 02/13/2020    3:46 PM 12/25/2018    9:41 AM  Depression screen PHQ 2/9  Decreased Interest 0 0 0 0 0  Down, Depressed, Hopeless 2 1 0 0 0  PHQ - 2 Score 2 1 0 0 0  Altered sleeping 2   1   Tired, decreased energy 1   1   Change in appetite    0   Feeling bad or failure about yourself  0   0   Trouble concentrating 1   0   Moving slowly or fidgety/restless 0   0   Suicidal thoughts 0   0   PHQ-9 Score 6   2   Difficult  doing work/chores Somewhat difficult   Not difficult at all      GEN: well developed, well nourished, no acute distress Eyes: conjunctiva and lids normal, PERRLA, EOMI ENT: TM clear, nares clear, oral exam WNL Neck: supple, no lymphadenopathy, no thyromegaly, no JVD Pulm: clear to auscultation and percussion, respiratory effort normal CV: regular rate and rhythm, S1-S2, no murmur, rub or gallop, no bruits, peripheral pulses normal and symmetric, no cyanosis, clubbing, edema or varicosities GI: soft, non-tender; no hepatosplenomegaly, masses; active bowel sounds all  quadrants GU: deferred Lymph: no cervical, axillary or inguinal adenopathy MSK: gait normal, muscle tone and strength WNL, no joint swelling, effusions, discoloration, crepitus  SKIN: clear, good turgor, color WNL, no rashes, lesions, or ulcerations Neuro: normal mental status, normal strength, sensation, and motion Psych: alert; oriented to person, place and time, normally interactive and not anxious or depressed in appearance.  All labs reviewed with patient. Results for orders placed or performed in visit on 08/31/22  [facility]  Result Value Ref Range   QUESTION/PROBLEM:     QUESTION: VERIFY [id number], NO PLASMA   CBC with Differential/Platelet  Result Value Ref Range   WBC 7.5 3.8 - 10.8 Thousand/uL   RBC 4.04 (L) 4.20 - 5.80 Million/uL   Hemoglobin 11.2 (L) 13.2 - 17.1 g/dL   HCT 45.4 (L) 09.8 - 11.9 %   MCV 86.6 80.0 - 100.0 fL   MCH 27.7 27.0 - 33.0 pg   MCHC 32.0 32.0 - 36.0 g/dL   RDW 14.7 82.9 - 56.2 %   Platelets 217 140 - 400 Thousand/uL   MPV 10.7 7.5 - 12.5 fL   Neutro Abs 4,673 1,500 - 7,800 cells/uL   Lymphs Abs 1,823 850 - 3,900 cells/uL   Absolute Monocytes 788 200 - 950 cells/uL   Eosinophils Absolute 180 15 - 500 cells/uL   Basophils Absolute 38 0 - 200 cells/uL   Neutrophils Relative % 62.3 %   Total Lymphocyte 24.3 %   Monocytes Relative 10.5 %   Eosinophils Relative 2.4 %   Basophils  Relative 0.5 %  CEA  Result Value Ref Range   CEA 5.8 (H) ng/mL  Hemoglobin A1c  Result Value Ref Range   Hgb A1c MFr Bld 6.4 (H) <5.7 % of total Hgb   Mean Plasma Glucose 137 mg/dL   eAG (mmol/L) 7.6 mmol/L  PSA  Result Value Ref Range   PSA 3.82 < OR = 4.00 ng/mL   AST: 538 ALT: 636 Alk Phos: 698 Bilirubin: 1.9  Assessment and Plan:     ICD-10-CM   1. Duodenal mass  K31.89     2. Stage 3a chronic kidney disease (HCC)  N18.31     3. Essential hypertension  I10     4. HYPERLIPIDEMIA  E78.2     5. Gastroesophageal reflux disease, unspecified whether esophagitis present  K21.9     6. Elevated transaminase level  R74.01      Duodenal mass.  At the time of this dictation, the patient's CT chest abdomen and pelvis liver series has returned.  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  He has an follow-up appointment with oncology today, and they will discuss all these findings with him.  [facility] will determine best course of action in terms of surgery with preceding chemo and radiation.  I completely defer to them.  CKD stable.  Blood pressure stable.  Elevated transaminase level, almost certainly secondary to liver metastases.  I did not best to counsel and offer support.  Health Maintenance Exam: The patient's preventative maintenance and recommended screening tests for an annual wellness exam were reviewed in full today. Brought up to date unless services declined.  Counselled on the importance of diet, exercise, and its role in overall health and mortality. The patient's FH and SH was reviewed, including their home life, tobacco status, and  drug and alcohol status.  Follow-up in 1 year for physical exam or additional follow-up below.  Disposition: No follow-ups on file.  No orders of the defined  types were placed in this encounter.  Medications Discontinued During This Encounter  Medication Reason   Glucosamine-Chondroit-Vit C-Mn (GLUCOSAMINE 1500 COMPLEX PO) Completed Course   Turmeric 450 MG CAPS Completed Course   No orders of the defined types were placed in this encounter.   Signed,  Elpidio Galea. Natausha Jungwirth, MD   Allergies as of 09/04/2022       Reactions   Cefaclor    REACTION: unspecified        Medication List        Accurate as of September 04, 2022 11:59 PM. If you have any questions, ask your nurse or doctor.          STOP taking these medications    GLUCOSAMINE 1500 COMPLEX PO Stopped by: Hannah Beat, MD   Turmeric 450 MG Caps Stopped by: Hannah Beat, MD       TAKE these medications    albuterol 108 (90 Base) MCG/ACT inhaler Commonly known as: ProAir HFA INHALE 2 PUFFS INTO THE LUNGS EVERY 6 HOURS AS NEEDED FOR WHEEZING.   aspirin 81 MG tablet Take 81 mg by mouth daily.   atorvastatin 40 MG tablet Commonly known as: LIPITOR TAKE 1 TABLET BY MOUTH DAILY   Cinnamon 500 MG capsule Take 500 mg by mouth daily.   fish oil-omega-3 fatty acids 1000 MG capsule Take 1 g by mouth daily.   fluticasone 50 MCG/ACT nasal spray Commonly known as: FLONASE Place 2 sprays into the nose daily as needed.   hydrochlorothiazide 12.5 MG tablet Commonly known as: HYDRODIURIL TAKE 1 TABLET(12.5 MG) BY MOUTH DAILY   losartan 100 MG tablet Commonly known as: COZAAR TAKE 1 TABLET(100 MG) BY MOUTH DAILY   multivitamin tablet Take 1 tablet by mouth daily.   omeprazole 20 MG capsule Commonly known as: PRILOSEC TAKE 2 CAPSULES BY MOUTH DAILY   tadalafil 5 MG tablet Commonly known as: CIALIS Take 5 mg by mouth every other day.   tamsulosin 0.4 MG Caps capsule Commonly known as: FLOMAX TAKE 1 CAPSULE(0.4 MG) BY MOUTH DAILY

## 2022-09-05 ENCOUNTER — Encounter: Payer: Self-pay | Admitting: Family Medicine

## 2022-09-05 DIAGNOSIS — K3189 Other diseases of stomach and duodenum: Secondary | ICD-10-CM | POA: Insufficient documentation

## 2022-09-05 LAB — HEMOGLOBIN A1C
Hgb A1c MFr Bld: 6.4 % of total Hgb — ABNORMAL HIGH (ref <5.7)
Mean Plasma Glucose: 137 mg/dL
eAG (mmol/L): 7.6 mmol/L

## 2022-09-05 LAB — CBC WITH DIFFERENTIAL/PLATELET
Basophils Relative: 0.5 %
Eosinophils Relative: 2.4 %
Hemoglobin: 11.2 g/dL — ABNORMAL LOW (ref 13.2–17.1)
MCH: 27.7 pg (ref 27.0–33.0)
Monocytes Relative: 10.5 %
Neutro Abs: 4673 cells/uL (ref 1500–7800)
Neutrophils Relative %: 62.3 %
Platelets: 217 10*3/uL (ref 140–400)
RBC: 4.04 10*6/uL — ABNORMAL LOW (ref 4.20–5.80)
Total Lymphocyte: 24.3 %
WBC: 7.5 10*3/uL (ref 3.8–10.8)

## 2022-09-05 LAB — LIPID PANEL
Cholesterol: 150 mg/dL (ref <200)
HDL: 46 mg/dL (ref >=40)
LDL Cholesterol (Calc): 82 mg/dL (calc)
Non-HDL Cholesterol (Calc): 104 mg/dL (calc) (ref <130)
Total CHOL/HDL Ratio: 3.3 (calc) (ref <5.0)
Triglycerides: 119 mg/dL (ref <150)

## 2022-09-05 LAB — BASIC METABOLIC PANEL
BUN/Creatinine Ratio: 22 (calc) (ref 6–22)
BUN: 29 mg/dL — ABNORMAL HIGH (ref 7–25)
CO2: 26 mmol/L (ref 20–32)
Calcium: 9.5 mg/dL (ref 8.6–10.3)
Chloride: 104 mmol/L (ref 98–110)
Creat: 1.33 mg/dL (ref 0.70–1.35)
Glucose, Bld: 135 mg/dL — ABNORMAL HIGH (ref 65–99)
Potassium: 4.4 mmol/L (ref 3.5–5.3)
Sodium: 141 mmol/L (ref 135–146)

## 2022-09-05 LAB — TIQ-MISC

## 2022-09-05 LAB — HEPATIC FUNCTION PANEL
AG Ratio: 1.3 (calc) (ref 1.0–2.5)
ALT: 368 U/L — ABNORMAL HIGH (ref 9–46)
AST: 419 U/L — ABNORMAL HIGH (ref 10–35)
Albumin: 3.9 g/dL (ref 3.6–5.1)
Alkaline phosphatase (APISO): 672 U/L — ABNORMAL HIGH (ref 35–144)
Bilirubin, Direct: 0.3 mg/dL — ABNORMAL HIGH (ref 0.0–0.2)
Globulin: 3 g/dL (calc) (ref 1.9–3.7)
Indirect Bilirubin: 0.6 mg/dL (calc) (ref 0.2–1.2)
Total Bilirubin: 0.9 mg/dL (ref 0.2–1.2)
Total Protein: 6.9 g/dL (ref 6.1–8.1)

## 2022-09-06 HISTORY — PX: BILE DUCT STENT PLACEMENT: SHX1227

## 2022-09-16 DIAGNOSIS — R7989 Other specified abnormal findings of blood chemistry: Secondary | ICD-10-CM | POA: Insufficient documentation

## 2022-09-17 DIAGNOSIS — K831 Obstruction of bile duct: Secondary | ICD-10-CM | POA: Insufficient documentation

## 2022-09-17 DIAGNOSIS — R739 Hyperglycemia, unspecified: Secondary | ICD-10-CM | POA: Insufficient documentation

## 2022-09-20 ENCOUNTER — Telehealth: Payer: Self-pay

## 2022-09-20 NOTE — Transitions of Care (Post Inpatient/ED Visit) (Signed)
   09/21/2022  Name: James Watts MRN: [id number] DOB: April 28, 1956  Today's TOC FU Call Status: Today's TOC FU Call Status:: Unsuccessful Call (2nd Attempt) Unsuccessful Call (2nd Attempt) Date: 09/21/22  Attempted to reach the patient regarding the most recent Inpatient/ED visit.  Follow Up Plan: Additional outreach attempts will be made to reach the patient to complete the Transitions of Care (Post Inpatient/ED visit) call.   Signature  Fredirick Maudlin

## 2022-09-21 DIAGNOSIS — R897 Abnormal histological findings in specimens from other organs, systems and tissues: Secondary | ICD-10-CM | POA: Insufficient documentation

## 2022-09-22 NOTE — Transitions of Care (Post Inpatient/ED Visit) (Signed)
   09/22/2022  Name: James Watts MRN: [id number] DOB: 23-Mar-1956  Today's TOC FU Call Status: Today's TOC FU Call Status:: Successful TOC FU Call Competed Unsuccessful Call (2nd Attempt) Date: 09/21/22  Transition Care Management Follow-up Telephone Call Type of Discharge: Inpatient Admission Primary Inpatient Discharge Diagnosis:: chest pain Reason for ED Visit: Cardiac Conditions How have you been since you were released from the hospital?: Better Any questions or concerns?: No  Items Reviewed: Did you receive and understand the discharge instructions provided?: Yes Any new allergies since your discharge?: No Dietary orders reviewed?: No Do you have support at home?: Yes People in Home: spouse  Medications Reviewed Today: Medications Reviewed Today     Reviewed by Annabell Sabal, CMA (Certified Medical Assistant) on 09/22/22 at 1454  Med List Status: <None>   Medication Order Taking? Sig Documenting Provider Last Dose Status Informant  albuterol (PROAIR HFA) 108 (90 Base) MCG/ACT inhaler [id number] No INHALE 2 PUFFS INTO THE LUNGS EVERY 6 HOURS AS NEEDED FOR WHEEZING. Hannah Beat, MD Taking Active   aspirin 81 MG tablet [id number] No Take 81 mg by mouth daily. [provider] Taking Active Self  atorvastatin (LIPITOR) 40 MG tablet [id number] No TAKE 1 TABLET BY MOUTH DAILY Copland, Spencer, MD Taking Active   Cinnamon 500 MG capsule [id number] No Take 500 mg by mouth daily. [provider] Taking Active Self  fish oil-omega-3 fatty acids 1000 MG capsule [id number] No Take 1 g by mouth daily. [provider] Taking Active Self  fluticasone (FLONASE) 50 MCG/ACT nasal spray [id number] No Place 2 sprays into the nose daily as needed. Copland, Karleen Hampshire, MD Taking Active Self  hydrochlorothiazide (HYDRODIURIL) 12.5 MG tablet [id number] No TAKE 1 TABLET(12.5 MG) BY MOUTH DAILY Copland, Spencer, MD Taking Active   losartan (COZAAR) 100 MG tablet [id number] No TAKE  1 TABLET(100 MG) BY MOUTH DAILY Copland, Spencer, MD Taking Active   Multiple Vitamin (MULTIVITAMIN) tablet [id number] No Take 1 tablet by mouth daily. [provider] Taking Active Self  omeprazole (PRILOSEC) 20 MG capsule [id number] No TAKE 2 CAPSULES BY MOUTH DAILY Copland, Spencer, MD Taking Active   tadalafil (CIALIS) 5 MG tablet [id number] No Take 5 mg by mouth every other day. [provider] Taking Active   tamsulosin (FLOMAX) 0.4 MG CAPS capsule [id number] No TAKE 1 CAPSULE(0.4 MG) BY MOUTH DAILY Copland, Spencer, MD Taking Active             Home Care and Equipment/Supplies: Were Home Health Services Ordered?: No Any new equipment or medical supplies ordered?: No  Functional Questionnaire: Do you need assistance with bathing/showering or dressing?: No Do you need assistance with meal preparation?: Yes Do you need assistance with eating?: No Do you have difficulty maintaining continence: No Do you need assistance with getting out of bed/getting out of a chair/moving?: No Do you have difficulty managing or taking your medications?: No  Follow up appointments reviewed: PCP Follow-up appointment confirmed?: Yes Date of PCP follow-up appointment?: 10/04/22 Follow-up Provider: [facility] Follow-up appointment confirmed?: NA Do you need transportation to your follow-up appointment?: No Do you understand care options if your condition(s) worsen?: Yes-patient verbalized understanding    SIGNATURE Bjorn Hallas,CMA [facility] , AWV Program

## 2022-10-03 DIAGNOSIS — C170 Malignant neoplasm of duodenum: Secondary | ICD-10-CM

## 2022-10-03 DIAGNOSIS — C787 Secondary malignant neoplasm of liver and intrahepatic bile duct: Secondary | ICD-10-CM

## 2022-10-03 HISTORY — DX: Secondary malignant neoplasm of liver and intrahepatic bile duct: C78.7

## 2022-10-03 HISTORY — DX: Malignant neoplasm of duodenum: C17.0

## 2022-10-03 NOTE — Progress Notes (Unsigned)
    James Sandler T. Genevia Bouldin, MD, CAQ Sports Medicine [facility] at [facility] [address] [place], [address]  Phone: [phone]  FAX: [phone]  James Watts - 67 y.o. male  MRN [id number]  Date of Birth: 06/03/1955  Date: 10/04/2022  PCP: Hannah Beat, MD  Referral: Hannah Beat, MD  No chief complaint on file.  Subjective:   James Watts is a 67 y.o. very pleasant male patient with There is no height or weight on file to calculate BMI. who presents with the following:  The patient is here for hospital follow-up.  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 He also had an ERCP with stent placement done by gastroenterology. 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  He also has an interval common bile duct stent placement.  This was initially placed on September 05, 2022.  Ultimately he had increasing LFTs with a malignant appearing severe duodenal stenosis.  On Sep 15, 2022, plastic stent was removed in biliary tree was swept, and a metal stent was placed.  He is currently undergoing chemotherapy. 09/12/2022 start date.  He was admitted on Sep 16, 2022 for chest pain at [facility].  Admit Date: 09/16/2022 Discharge Date: 09/19/2022 Admitting Physician: April Marja Kays, MD Discharge Physician: Vassie Loll, MD Primary Care Provider: Royston Cowper, MD, Phone [phone]  Inpatient team at [facility] stopped his statin, blood pressure medication.  They recommended following up with me in follow-up.  He was admitted in the setting of post ERCP pancreatitis.  Lipase was over 4000 on admission.  He did well after IV fluid and diet changes.  He has a malignant obstruction of the bile duct.    Review of  Systems is noted in the HPI, as appropriate  Objective:   There were no vitals taken for this visit.  GEN: No acute distress; alert,appropriate. PULM: Breathing comfortably in no respiratory distress PSYCH: Normally interactive.   Laboratory and Imaging Data:  Assessment and Plan:   ***

## 2022-10-04 ENCOUNTER — Ambulatory Visit (INDEPENDENT_AMBULATORY_CARE_PROVIDER_SITE_OTHER): Payer: Medicare Other | Admitting: Family Medicine

## 2022-10-04 ENCOUNTER — Encounter: Payer: Self-pay | Admitting: Family Medicine

## 2022-10-04 VITALS — BP 110/60 | HR 91 | Temp 97.8°F | Ht 71.0 in | Wt 218.4 lb

## 2022-10-04 DIAGNOSIS — R079 Chest pain, unspecified: Secondary | ICD-10-CM

## 2022-10-04 DIAGNOSIS — K851 Biliary acute pancreatitis without necrosis or infection: Secondary | ICD-10-CM | POA: Diagnosis not present

## 2022-10-04 DIAGNOSIS — C787 Secondary malignant neoplasm of liver and intrahepatic bile duct: Secondary | ICD-10-CM | POA: Diagnosis not present

## 2022-10-04 DIAGNOSIS — R739 Hyperglycemia, unspecified: Secondary | ICD-10-CM

## 2022-10-04 DIAGNOSIS — C170 Malignant neoplasm of duodenum: Secondary | ICD-10-CM

## 2022-10-04 DIAGNOSIS — D508 Other iron deficiency anemias: Secondary | ICD-10-CM

## 2022-10-04 LAB — CBC WITH DIFFERENTIAL/PLATELET
Basophils Absolute: 0 10*3/uL (ref 0.0–0.1)
Basophils Relative: 0.5 % (ref 0.0–3.0)
Eosinophils Absolute: 0.1 10*3/uL (ref 0.0–0.7)
Eosinophils Relative: 1.9 % (ref 0.0–5.0)
HCT: 31.4 % — ABNORMAL LOW (ref 39.0–52.0)
Hemoglobin: 10.1 g/dL — ABNORMAL LOW (ref 13.0–17.0)
Lymphocytes Relative: 27.1 % (ref 12.0–46.0)
Lymphs Abs: 2 10*3/uL (ref 0.7–4.0)
MCHC: 32.3 g/dL (ref 30.0–36.0)
MCV: 83.4 fl (ref 78.0–100.0)
Monocytes Absolute: 1.3 10*3/uL — ABNORMAL HIGH (ref 0.1–1.0)
Monocytes Relative: 16.7 % — ABNORMAL HIGH (ref 3.0–12.0)
Neutro Abs: 4.1 10*3/uL (ref 1.4–7.7)
Neutrophils Relative %: 53.8 % (ref 43.0–77.0)
Platelets: 259 10*3/uL (ref 150.0–400.0)
RBC: 3.76 Mil/uL — ABNORMAL LOW (ref 4.22–5.81)
RDW: 16.4 % — ABNORMAL HIGH (ref 11.5–15.5)
WBC: 7.5 10*3/uL (ref 4.0–10.5)

## 2022-10-04 LAB — BASIC METABOLIC PANEL
BUN: 23 mg/dL (ref 6–23)
CO2: 31 mEq/L (ref 19–32)
Calcium: 9.1 mg/dL (ref 8.4–10.5)
Chloride: 100 mEq/L (ref 96–112)
Creatinine, Ser: 1.09 mg/dL (ref 0.40–1.50)
GFR: 69.36 mL/min (ref >60.00)
Glucose, Bld: 130 mg/dL — ABNORMAL HIGH (ref 70–99)
Potassium: 3.6 mEq/L (ref 3.5–5.1)
Sodium: 140 mEq/L (ref 135–145)

## 2022-10-04 LAB — HEPATIC FUNCTION PANEL
ALT: 39 U/L (ref 0–53)
AST: 36 U/L (ref 0–37)
Albumin: 3.3 g/dL — ABNORMAL LOW (ref 3.5–5.2)
Alkaline Phosphatase: 251 U/L — ABNORMAL HIGH (ref 39–117)
Bilirubin, Direct: 0.3 mg/dL (ref 0.0–0.3)
Total Bilirubin: 0.7 mg/dL (ref 0.2–1.2)
Total Protein: 7 g/dL (ref 6.0–8.3)

## 2022-10-04 LAB — LIPASE: Lipase: 169 U/L — ABNORMAL HIGH (ref 11.0–59.0)

## 2022-10-04 MED ORDER — METFORMIN HCL ER 500 MG PO TB24: 500.0000 mg | ORAL_TABLET | Freq: Every day | ORAL | 3 refills | Status: DC

## 2022-10-06 ENCOUNTER — Telehealth: Payer: Self-pay | Admitting: Family Medicine

## 2022-10-06 NOTE — Telephone Encounter (Signed)
Can you call James Watts back:  I did not do all of the required documentation for a Medicare Wellness Exam at his physical.  From what I recall, I thought that he had a upcoming Medicare Wellness appointment with nursing.  My sincere apologies if he would have preferred for me to do AMW at that time.

## 2022-10-06 NOTE — Telephone Encounter (Signed)
Called and spoke with patient, he stated he must have been confused because he was under the impression that everything would be done at the office during his CPE appt. Offered apologies for the misunderstanding on our part, patient was appreciative.   Patient would like to set up phone call for AWV with nurse. Routing back to Alden to get patient on AWV nurse schedule.

## 2022-10-06 NOTE — Telephone Encounter (Signed)
Contacted James Watts to schedule their annual wellness visit. Patient declined to schedule AWV at this time.Patient declined AWV, because he states he had it done when he had his CPE. Informed him that it was not done. So he asked me to let PCP know to check his records.Message sent to PCP.  [facility] Care Guide [facility] AWV TEAM Direct Dial: [phone]

## 2022-10-06 NOTE — Telephone Encounter (Signed)
Appointment scheduled.

## 2022-10-17 DIAGNOSIS — E611 Iron deficiency: Secondary | ICD-10-CM | POA: Insufficient documentation

## 2022-10-23 ENCOUNTER — Other Ambulatory Visit: Payer: Self-pay | Admitting: Family Medicine

## 2022-11-06 ENCOUNTER — Other Ambulatory Visit: Payer: Self-pay | Admitting: Family Medicine

## 2022-11-10 ENCOUNTER — Other Ambulatory Visit: Payer: Self-pay | Admitting: Family Medicine

## 2022-12-12 ENCOUNTER — Ambulatory Visit (INDEPENDENT_AMBULATORY_CARE_PROVIDER_SITE_OTHER): Payer: Medicare Other

## 2022-12-12 VITALS — Ht 71.5 in | Wt 217.0 lb

## 2022-12-12 DIAGNOSIS — Z0000 Encounter for general adult medical examination without abnormal findings: Secondary | ICD-10-CM | POA: Diagnosis not present

## 2022-12-12 NOTE — Progress Notes (Signed)
Subjective:   James Watts is a 67 y.o. male who presents for Medicare Annual/Subsequent preventive examination.  Visit Complete: Virtual  I connected with  Tiana Loft on 12/12/22 by a audio enabled telemedicine application and verified that I am speaking with the correct person using two identifiers.  Patient Location: Home  Provider Location: Office/Clinic  I discussed the limitations of evaluation and management by telemedicine. The patient expressed understanding and agreed to proceed.  Patient Medicare AWV questionnaire was completed by the patient on 12/12/22; I have confirmed that all information answered by patient is correct and no changes since this date.  Vital Signs: Unable to obtain new vitals due to this being a telehealth visit.   Review of Systems      Cardiac Risk Factors include: advanced age (>60men, >34 women);hypertension;male gender;dyslipidemia     Objective:    Today's Vitals   12/12/22 0848  Weight: 217 lb (98.4 kg)  Height: 5' 11.5" (1.816 m)   Body mass index is 29.84 kg/m.     12/12/2022    8:56 AM 09/19/2021    8:23 AM 09/10/2014    6:25 AM  Advanced Directives  Does Patient Have a Medical Advance Directive? Yes Yes No;Yes  Type of Estate agent of [place];Living will Healthcare Power of [place];Living will Healthcare Power of [place];Living will  Does patient want to make changes to medical advance directive?  Yes (Inpatient - patient defers changing a medical advance directive and declines information at this time) No - Patient declined  Copy of Healthcare Power of Attorney in Chart? No - copy requested No - copy requested No - copy requested  Would patient like information on creating a medical advance directive?   No - patient declined information    Current Medications (verified) Outpatient Encounter Medications as of 12/12/2022  Medication Sig   albuterol (VENTOLIN HFA) 108 (90 Base) MCG/ACT inhaler INHALE  2 PUFFS INTO THE LUNGS EVERY 6 HOURS AS NEEDED FOR WHEEZING   Alcohol Swabs (ALCOHOL PREP) 70 % PADS Apply topically.   cetirizine (ZYRTEC) 10 MG tablet Take 10 mg by mouth daily as needed for allergies.   ferrous sulfate 325 (65 FE) MG EC tablet Take 1 tablet by mouth daily with breakfast.   fluticasone (FLONASE) 50 MCG/ACT nasal spray Place 2 sprays into the nose daily as needed.   glucose blood (PRECISION QID TEST) test strip 1 each by Other route in the morning, at noon, and at bedtime.   metFORMIN (GLUCOPHAGE-XR) 500 MG 24 hr tablet Take 1 tablet (500 mg total) by mouth daily with breakfast.   Multiple Vitamin (MULTIVITAMIN) tablet Take 1 tablet by mouth daily.   omeprazole (PRILOSEC) 20 MG capsule TAKE 2 CAPSULES BY MOUTH DAILY   tadalafil (CIALIS) 5 MG tablet Take 5 mg by mouth every other day.   tamsulosin (FLOMAX) 0.4 MG CAPS capsule TAKE 1 CAPSULE(0.4 MG) BY MOUTH DAILY   TRUEPLUS 5-BEVEL PEN NEEDLES 31G X 5 MM MISC    aspirin 81 MG tablet Take 81 mg by mouth daily. (Patient not taking: Reported on 12/12/2022)   Cinnamon 500 MG capsule Take 500 mg by mouth daily. (Patient not taking: Reported on 12/12/2022)   ondansetron (ZOFRAN) 8 MG tablet Take 1 tablet by mouth every 8 (eight) hours as needed. (Patient not taking: Reported on 12/12/2022)   No facility-administered encounter medications on file as of 12/12/2022.    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 No pertinent family history. Social History   Socioeconomic History   Marital status: Married    Spouse name: Not on file   Number of children: Not on file   Years of education: Not on file   Highest education level: Bachelor's degree (e.g., BA, AB, BS)  Occupational History   Not on file  Tobacco Use   Smoking status: Never   Smokeless tobacco: Never  Substance and Sexual Activity   Alcohol use: Yes    Alcohol/week: 0.0 standard drinks of alcohol    Comment: occassional   Drug use: No   Sexual activity: Not on file  Other Topics Concern   Not on file  Social History Narrative   Not on file   Social Determinants of Health   Financial Resource Strain: Low Risk  (12/12/2022)   Overall Financial Resource Strain (CARDIA)    Difficulty of Paying Living Expenses: Not hard at all  Food Insecurity: No Food Insecurity (12/12/2022)   Hunger Vital Sign    Worried About Running Out of Food in the Last Year: Never true    Ran Out of Food in the Last Year: Never true  Transportation Needs: No Transportation Needs (12/12/2022)   PRAPARE - Administrator, Civil Service (Medical): No    Lack of Transportation (Non-Medical): No  Physical Activity: Sufficiently Active (12/12/2022)   Exercise Vital Sign    Days of Exercise per Week: 3 days    Minutes of Exercise per Session: 60 min  Recent Concern: Physical Activity - Insufficiently Active (09/30/2022)   Exercise Vital Sign    Days of Exercise per Week: 3 days    Minutes of Exercise per Session: 30 min  Stress: No Stress Concern Present (12/12/2022)   [employer] of Occupational Health - Occupational Stress Questionnaire    Feeling of Stress : Not at all  Recent Concern: Stress - Stress Concern Present (09/30/2022)   [employer] of Occupational Health - Occupational Stress Questionnaire    Feeling of Stress : To some extent  Social Connections:  Socially Integrated (12/12/2022)   Social Connection and Isolation Panel [NHANES]    Frequency of Communication with Friends and Family: More than three times a week    Frequency of Social Gatherings with Friends and Family: More than three times a week    Attends Religious Services: More than 4 times per year    Active Member of [employer] or Organizations: Yes    Attends Engineer, structural: More than 4 times per year    Marital Status: Married    Tobacco Counseling Counseling given: Not Answered   Clinical Intake:  Pre-visit preparation completed: Yes  Pain : No/denies pain     BMI - recorded: 29.84 Nutritional Status: BMI 25 -29 Overweight Nutritional Risks: None Diabetes: No  How often do you need to have someone help you when  you read instructions, pamphlets, or other written materials from your doctor or pharmacy?: 1 - Never  Interpreter Needed?: No  Information entered by :: C. LPN   Activities of Daily Living    12/11/2022   11:01 AM  In your present state of health, do you have any difficulty performing the following activities:  Hearing? 0  Vision? 0  Difficulty concentrating or making decisions? 0  Walking or climbing stairs? 0  Dressing or bathing? 0  Doing errands, shopping? 0  Preparing Food and eating ? N  Using the Toilet? N  In the past six months, have you accidently leaked urine? N  Comment Has urgency  Do you have problems with loss of bowel control? N  Managing your Medications? N  Managing your Finances? N  Housekeeping or managing your Housekeeping? N    Patient Care Team: Hannah Beat, MD as PCP - General (Family Medicine)  Indicate any recent Medical Services you may have received from other than [facility] providers in the past year (date may be approximate).     Assessment:   This is a routine wellness examination for Rachit.  Hearing/Vision screen Hearing Screening - Comments:: Denies hearing difficulties   Vision  Screening - Comments:: Glasses - [facility] - UTD on eye exams  Dietary issues and exercise activities discussed:     Goals Addressed             This Visit's Progress    Patient Stated       Beat cancer.       Depression Screen    12/12/2022    8:55 AM 09/04/2022    3:54 PM 09/19/2021    8:21 AM 05/23/2021    9:00 AM 02/13/2020    3:46 PM 12/25/2018    9:41 AM 08/29/2017    9:07 AM  PHQ 2/9 Scores  PHQ - 2 Score 0 2 1 0 0 0 0  PHQ- 9 Score  6   2      Fall Risk    12/11/2022   11:01 AM 09/04/2022    3:51 PM 09/19/2021    8:25 AM 05/23/2021    9:00 AM 02/13/2020    3:46 PM  Fall Risk   Falls in the past year? 0 0 0 0 0  Number falls in past yr: 0 0 0  0  Injury with Fall? 0 0 0  0  Risk for fall due to : No Fall Risks No Fall Risks No Fall Risks    Follow up Falls evaluation completed;Falls prevention discussed Falls evaluation completed Falls evaluation completed      MEDICARE RISK AT HOME:   TIMED UP AND GO:  Was the test performed?  No    Cognitive Function:        12/12/2022    8:58 AM 09/19/2021    8:27 AM  6CIT Screen  What Year? 0 points 0 points  What month? 0 points 0 points  What time? 0 points 0 points  Count back from 20 0 points 0 points  Months in reverse 0 points 0 points  Repeat phrase 0 points 0 points  Total Score 0 points 0 points    Immunizations Immunization History  Administered Date(s) Administered   Influenza Split 03/22/2011, 02/13/2012   Influenza Whole 02/25/2007, 03/03/2008, 03/22/2009, 04/06/2010   Influenza, High Dose Seasonal PF 01/16/2019, 01/20/2020, 01/27/2021   Influenza,inj,Quad PF,6+ Mos 02/27/2013, 03/06/2014, 02/10/2015, 02/25/2016, 02/21/2017, 01/29/2018   Moderna Covid-19 Vaccine Bivalent Booster  69yrs & up 01/27/2021   PFIZER(Purple Top)SARS-COV-2 Vaccination 05/28/2019, 06/18/2019, 02/10/2020   Pneumococcal Conjugate-13 03/27/2019   Pneumococcal Polysaccharide-23 05/23/2021   Td 03/22/2009   Tdap  07/11/2012, 08/02/2020   Zoster Recombinant(Shingrix) 01/23/2019, 04/04/2019   Zoster, Live 03/17/2015    TDAP status: Up to date  Flu Vaccine status: Due, Education has been provided regarding the importance of this vaccine. Advised may receive this vaccine at local pharmacy or Health Dept. Aware to provide a copy of the vaccination record if obtained from local pharmacy or Health Dept. Verbalized acceptance and understanding.  Pneumococcal vaccine status: Up to date  Covid-19 vaccine status: Information provided on how to obtain vaccines.   Qualifies for Shingles Vaccine? Yes   Zostavax completed Yes   Shingrix Completed?: Yes  Screening Tests Health Maintenance  Topic Date Due   COVID-19 Vaccine (5 - 2023-24 season) 01/06/2022   INFLUENZA VACCINE  12/07/2022   Medicare Annual Wellness (AWV)  12/12/2023   Colonoscopy  12/27/2028   DTaP/Tdap/Td (4 - Td or Tdap) 08/03/2030   Pneumonia Vaccine 72+ Years old  Completed   Hepatitis C Screening  Completed   Zoster Vaccines- Shingrix  Completed   HPV VACCINES  Aged Out    Health Maintenance  Health Maintenance Due  Topic Date Due   COVID-19 Vaccine (5 - 2023-24 season) 01/06/2022   INFLUENZA VACCINE  12/07/2022    Colorectal cancer screening: Type of screening: Colonoscopy. Completed 12/27/21. Repeat every 7 years  Lung Cancer Screening: (Low Dose CT Chest recommended if Age 6-80 years, 20 pack-year currently smoking OR have quit w/in 15years.) does not qualify.   Lung Cancer Screening Referral: no  Additional Screening:  Hepatitis C Screening: does qualify; Completed 04/09/13  Vision Screening: Recommended annual ophthalmology exams for early detection of glaucoma and other disorders of the eye. Is the patient up to date with their annual eye exam?  Yes  Who is the provider or what is the name of the office in which the patient attends annual eye exams? [facility] If pt is not established with a provider, would they  like to be referred to a provider to establish care? Yes .   Dental Screening: Recommended annual dental exams for proper oral hygiene    Community Resource Referral / Chronic Care Management: CRR required this visit?  No   CCM required this visit?  No     Plan:     I have personally reviewed and noted the following in the patient's chart:   Medical and social history Use of alcohol, tobacco or illicit drugs  Current medications and supplements including opioid prescriptions. Patient is not currently taking opioid prescriptions. Functional ability and status Nutritional status Physical activity Advanced directives List of other physicians Hospitalizations, surgeries, and ER visits in previous 12 months Vitals Screenings to include cognitive, depression, and falls Referrals and appointments  In addition, I have reviewed and discussed with patient certain preventive protocols, quality metrics, and best practice recommendations. A written personalized care plan for preventive services as well as general preventive health recommendations were provided to patient.     Maryan Puls, LPN   4/0/3474   After Visit Summary: (MyChart) Due to this being a telephonic visit, the after visit summary with patients personalized plan was offered to patient via MyChart   Nurse Notes: none

## 2022-12-12 NOTE — Patient Instructions (Signed)
James Watts , Thank you for taking time to come for your Medicare Wellness Visit. I appreciate your ongoing commitment to your health goals. Please review the following plan we discussed and let me know if I can assist you in the future.   Referrals/Orders/Follow-Ups/Clinician Recommendations: Aim for 30 minutes of exercise or brisk walking, 6-8 glasses of water, and 5 servings of fruits and vegetables each day.   This is a list of the screening recommended for you and due dates:  Health Maintenance  Topic Date Due   COVID-19 Vaccine (5 - 2023-24 season) 01/06/2022   Medicare Annual Wellness Visit  09/20/2022   Flu Shot  12/07/2022   Colon Cancer Screening  12/27/2028   DTaP/Tdap/Td vaccine (4 - Td or Tdap) 08/03/2030   Pneumonia Vaccine  Completed   Hepatitis C Screening  Completed   Zoster (Shingles) Vaccine  Completed   HPV Vaccine  Aged Out    Advanced directives: (Copy Requested) Please bring a copy of your health care power of attorney and living will to the office to be added to your chart at your convenience.  Next Medicare Annual Wellness Visit scheduled for next year: Yes  Preventive Care 53 Years and Older, Male  Preventive care refers to lifestyle choices and visits with your health care provider that can promote health and wellness. What does preventive care include? A yearly physical exam. This is also called an annual well check. Dental exams once or twice a year. Routine eye exams. Ask your health care provider how often you should have your eyes checked. Personal lifestyle choices, including: Daily care of your teeth and gums. Regular physical activity. Eating a healthy diet. Avoiding tobacco and drug use. Limiting alcohol use. Practicing safe sex. Taking low doses of aspirin every day. Taking vitamin and mineral supplements as recommended by your health care provider. What happens during an annual well check? The services and screenings done by your health  care provider during your annual well check will depend on your age, overall health, lifestyle risk factors, and family history of disease. Counseling  Your health care provider may ask you questions about your: Alcohol use. Tobacco use. Drug use. Emotional well-being. Home and relationship well-being. Sexual activity. Eating habits. History of falls. Memory and ability to understand (cognition). Work and work Astronomer. Screening  You may have the following tests or measurements: Height, weight, and BMI. Blood pressure. Lipid and cholesterol levels. These may be checked every 5 years, or more frequently if you are over 78 years old. Skin check. Lung cancer screening. You may have this screening every year starting at age 30 if you have a 30-pack-year history of smoking and currently smoke or have quit within the past 15 years. Fecal occult blood test (FOBT) of the stool. You may have this test every year starting at age 71. Flexible sigmoidoscopy or colonoscopy. You may have a sigmoidoscopy every 5 years or a colonoscopy every 10 years starting at age 56. Prostate cancer screening. Recommendations will vary depending on your family history and other risks. Hepatitis C blood test. Hepatitis B blood test. Sexually transmitted disease (STD) testing. Diabetes screening. This is done by checking your blood sugar (glucose) after you have not eaten for a while (fasting). You may have this done every 1-3 years. Abdominal aortic aneurysm (AAA) screening. You may need this if you are a current or former smoker. Osteoporosis. You may be screened starting at age 67 if you are at high risk. Talk with your  health care provider about your test results, treatment options, and if necessary, the need for more tests. Vaccines  Your health care provider may recommend certain vaccines, such as: Influenza vaccine. This is recommended every year. Tetanus, diphtheria, and acellular pertussis (Tdap, Td)  vaccine. You may need a Td booster every 10 years. Zoster vaccine. You may need this after age 51. Pneumococcal 13-valent conjugate (PCV13) vaccine. One dose is recommended after age 20. Pneumococcal polysaccharide (PPSV23) vaccine. One dose is recommended after age 7. Talk to your health care provider about which screenings and vaccines you need and how often you need them. This information is not intended to replace advice given to you by your health care provider. Make sure you discuss any questions you have with your health care provider. Document Released: 05/21/2015 Document Revised: 01/12/2016 Document Reviewed: 02/23/2015 Elsevier Interactive Patient Education  2017 [employer].  Fall Prevention in the Home Falls can cause injuries. They can happen to people of all ages. There are many things you can do to make your home safe and to help prevent falls. What can I do on the outside of my home? Regularly fix the edges of walkways and driveways and fix any cracks. Remove anything that might make you trip as you walk through a door, such as a raised step or threshold. Trim any bushes or trees on the path to your home. Use bright outdoor lighting. Clear any walking paths of anything that might make someone trip, such as rocks or tools. Regularly check to see if handrails are loose or broken. Make sure that both sides of any steps have handrails. Any raised decks and porches should have guardrails on the edges. Have any leaves, snow, or ice cleared regularly. Use sand or salt on walking paths during winter. Clean up any spills in your garage right away. This includes oil or grease spills. What can I do in the bathroom? Use night lights. Install grab bars by the toilet and in the tub and shower. Do not use towel bars as grab bars. Use non-skid mats or decals in the tub or shower. If you need to sit down in the shower, use a plastic, non-slip stool. Keep the floor dry. Clean up any  water that spills on the floor as soon as it happens. Remove soap buildup in the tub or shower regularly. Attach bath mats securely with double-sided non-slip rug tape. Do not have throw rugs and other things on the floor that can make you trip. What can I do in the bedroom? Use night lights. Make sure that you have a light by your bed that is easy to reach. Do not use any sheets or blankets that are too big for your bed. They should not hang down onto the floor. Have a firm chair that has side arms. You can use this for support while you get dressed. Do not have throw rugs and other things on the floor that can make you trip. What can I do in the kitchen? Clean up any spills right away. Avoid walking on wet floors. Keep items that you use a lot in easy-to-reach places. If you need to reach something above you, use a strong step stool that has a grab bar. Keep electrical cords out of the way. Do not use floor polish or wax that makes floors slippery. If you must use wax, use non-skid floor wax. Do not have throw rugs and other things on the floor that can make you trip. What  can I do with my stairs? Do not leave any items on the stairs. Make sure that there are handrails on both sides of the stairs and use them. Fix handrails that are broken or loose. Make sure that handrails are as long as the stairways. Check any carpeting to make sure that it is firmly attached to the stairs. Fix any carpet that is loose or worn. Avoid having throw rugs at the top or bottom of the stairs. If you do have throw rugs, attach them to the floor with carpet tape. Make sure that you have a light switch at the top of the stairs and the bottom of the stairs. If you do not have them, ask someone to add them for you. What else can I do to help prevent falls? Wear shoes that: Do not have high heels. Have rubber bottoms. Are comfortable and fit you well. Are closed at the toe. Do not wear sandals. If you use a  stepladder: Make sure that it is fully opened. Do not climb a closed stepladder. Make sure that both sides of the stepladder are locked into place. Ask someone to hold it for you, if possible. Clearly mark and make sure that you can see: Any grab bars or handrails. First and last steps. Where the edge of each step is. Use tools that help you move around (mobility aids) if they are needed. These include: Canes. Walkers. Scooters. Crutches. Turn on the lights when you go into a dark area. Replace any light bulbs as soon as they burn out. Set up your furniture so you have a clear path. Avoid moving your furniture around. If any of your floors are uneven, fix them. If there are any pets around you, be aware of where they are. Review your medicines with your doctor. Some medicines can make you feel dizzy. This can increase your chance of falling. Ask your doctor what other things that you can do to help prevent falls. This information is not intended to replace advice given to you by your health care provider. Make sure you discuss any questions you have with your health care provider. Document Released: 02/18/2009 Document Revised: 09/30/2015 Document Reviewed: 05/29/2014 Elsevier Interactive Patient Education  2017 [employer].

## 2023-03-12 ENCOUNTER — Other Ambulatory Visit: Payer: Self-pay | Admitting: Family Medicine

## 2023-03-16 ENCOUNTER — Encounter: Payer: Self-pay | Admitting: Family Medicine

## 2023-03-18 MED ORDER — HYDROCHLOROTHIAZIDE 12.5 MG PO TABS: 12.5000 mg | ORAL_TABLET | Freq: Every day | ORAL | 3 refills | Status: DC

## 2023-03-18 MED ORDER — ATORVASTATIN CALCIUM 40 MG PO TABS: 40.0000 mg | ORAL_TABLET | Freq: Every day | ORAL | 3 refills | Status: DC

## 2023-03-19 MED ORDER — ATORVASTATIN CALCIUM 40 MG PO TABS: 40.0000 mg | ORAL_TABLET | Freq: Every day | ORAL | 1 refills | Status: DC

## 2023-03-19 MED ORDER — HYDROCHLOROTHIAZIDE 12.5 MG PO TABS: 12.5000 mg | ORAL_TABLET | Freq: Every day | ORAL | 1 refills | Status: DC

## 2023-03-19 NOTE — Addendum Note (Signed)
Addended by: Damita Lack on: 03/19/2023 08:57 AM   Modules accepted: Orders

## 2023-07-24 DIAGNOSIS — E039 Hypothyroidism, unspecified: Secondary | ICD-10-CM | POA: Insufficient documentation

## 2023-07-24 HISTORY — DX: Hypothyroidism, unspecified: E03.9

## 2023-09-10 ENCOUNTER — Encounter: Payer: Self-pay | Admitting: Family Medicine

## 2023-09-11 NOTE — Telephone Encounter (Signed)
 James Watts, can you double-check and make sure that I sent this to the correct pool?   Can you please have someone set it up.preferably the last week of July or last two weeks of August?   Can you help James Watts set up an in-office medicare wellness exam with me as above.  Thanks!

## 2023-09-17 ENCOUNTER — Encounter: Payer: Self-pay | Admitting: Family Medicine

## 2023-09-17 ENCOUNTER — Ambulatory Visit: Payer: Self-pay | Admitting: *Deleted

## 2023-09-17 NOTE — Telephone Encounter (Signed)
 Spoke with Mr. Chaffer.  He states the openings Dr. Geralyn Knee had for Wednesday and Thursday conflicted with other appointments and meetings he had so he is good keeping the scheduled appointment on 09/24/23.  FYI to Dr. Geralyn Knee.

## 2023-09-17 NOTE — Telephone Encounter (Signed)
  Chief Complaint: right breast nipple area pain to touch hx stage IV CA Symptoms: in shower and noted pain right nipple area when touched Frequency: today  and 1st noted 1 week ago  Pertinent Negatives: Patient denies redness no fever no lump noted  Disposition: [] ED /[] Urgent Care (no appt availability in office) / [x] Appointment(In office/virtual)/ []  [facility]/ [] Home Care/ [] Refused Recommended Disposition /[] [facility]/ []  Follow-up with PCP Additional Notes:   Earliest patient can schedule appt other than today is Monday. Scheduled appt with PCP 09/24/23. Please advise if patient can be seen today or if PCP ok for patient to wait until 09/24/23.           Copied from CRM [id number]. Topic: Clinical - Red Word Triage >> Sep 17, 2023  2:03 PM Caliyah H wrote: Red Word that prompted transfer to Nurse Triage: Patient called to schedule an appointment. He reported that while showering, he experienced pain in his right nipple after rubbing the area. The pain has since become continuous. Given his history of stage 4 cancer, he contacted his oncologist, who advised him to reach out to Dr. Geralyn Knee office for further evaluation. Reason for Disposition  [1] Breast pain or tenderness AND [2] occurs monthly before menstrual period AND [3] has NOT been evaluated by a doctor (or NP/PA)  Answer Assessment - Initial Assessment Questions 1. SYMPTOM: "What's the main symptom you're concerned about?"  (e.g., lump, pain, rash, nipple discharge)     Pain right nipple area 2. LOCATION: "Where is the right  located?"     Right nipple  3. ONSET: "When did pain  start?"     Today  4. PRIOR HISTORY: "Do you have any history of prior problems with your breasts?" (e.g., lumps, cancer, fibrocystic breast disease)     Hx CA 5. CAUSE: "What do you think is causing this symptom?"     Not sure  6. OTHER SYMPTOMS: "Do you have any other symptoms?" (e.g., fever, breast pain, redness or rash,  nipple discharge)     Right breast pain when touched 7. PREGNANCY-BREASTFEEDING: "Is there any chance you are pregnant?" "When was your last menstrual period?" "Are you breastfeeding?"     na  Protocols used: Breast Symptoms-A-AH

## 2023-09-17 NOTE — Telephone Encounter (Signed)
 I have plenty of appointments on Wed and Thurs.  Please assist.

## 2023-09-23 NOTE — Progress Notes (Signed)
 James Woolridge T. [name], MD, CAQ Sports Medicine [facility] at [facility] [address] [place], [address]  Phone: [phone]  FAX: [phone]  James Watts - 68 y.o. male  MRN [id number]  Date of Birth: 1956/01/05  Date: 09/24/2023  PCP: Scherrie Curt, MD  Referral: Scherrie Curt, MD  Chief Complaint  Patient presents with   Nipple Tenderness    Right   Subjective:   James Watts is a 68 y.o. very pleasant male patient with Body mass index is 34.22 kg/m. 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  He has been having some right sided nipple pain.  He has a palpable discrepancy behind the right nipple.  R sided nipple pain like a pinprick Ongoing for a couple of weeks Sensitive  Mom and sisters x2 with all breast cancer  Scans have all been looking good and there is no sign of hypermetabolic activity in the right chest.  Cancer treatments are going well.   [address], [address] [phone] [facility]    Review of Systems is noted in the HPI, as appropriate  Objective:   BP 130/70   Pulse 60   Temp 98.1 F (36.7 C) (Temporal)   Ht 5\' 11"  (1.803 m)   Wt 245 lb 6 oz (111.3 kg)   SpO2 97%   BMI 34.22 kg/m   GEN: No acute distress; alert,appropriate. PULM: Breathing comfortably in no respiratory distress PSYCH: Normally interactive.  There is a palpable textural change behind the right nipple compared to the surrounding tissue and is mildly tender to palpation. There is no skin change No other masses in the right chest  Laboratory and Imaging Data:  Assessment and Plan:     ICD-10-CM   1. Subareolar mass of right breast  N63.41 Prolactin    FSH/LH    US  BREAST COMPLETE UNI RIGHT INC AXILLA    MM 3D DIAGNOSTIC MAMMOGRAM BILATERAL BREAST    2. Breast pain, right  N64.4 Prolactin     FSH/LH    US  BREAST COMPLETE UNI RIGHT INC AXILLA    MM 3D DIAGNOSTIC MAMMOGRAM BILATERAL BREAST    3. Family history of breast cancer  Z80.3 Prolactin    FSH/LH    US  BREAST COMPLETE UNI RIGHT INC AXILLA    MM 3D DIAGNOSTIC MAMMOGRAM BILATERAL BREAST    4. Adenocarcinoma of small intestine, stage 4 (HCC)  C17.9 US  BREAST COMPLETE UNI RIGHT INC AXILLA    MM 3D DIAGNOSTIC MAMMOGRAM BILATERAL BREAST    5. Metastatic carcinoma to liver (HCC)  C78.7 US  BREAST COMPLETE UNI RIGHT INC AXILLA    MM 3D DIAGNOSTIC MAMMOGRAM BILATERAL BREAST     With right chest mass, painful nipple, I am going to get a diagnostic mammogram and ultrasound of the right side, as well.  he does have notable change and he has multiple family members including sisters and his mother's with breast cancer.  Medication Management during today's office visit: No orders of the defined types were placed in this encounter.  Medications Discontinued During This Encounter  Medication Reason   aspirin 81 MG tablet Completed Course   Cinnamon 500 MG capsule Completed Course   TRUEPLUS 5-BEVEL PEN NEEDLES 31G X 5 MM MISC Completed Course    Orders placed today for conditions managed today: Orders Placed This Encounter  Procedures   US  BREAST COMPLETE  UNI RIGHT INC AXILLA   MM 3D DIAGNOSTIC MAMMOGRAM BILATERAL BREAST   Prolactin   FSH/LH    Disposition: No follow-ups on file.  Dr[employer] One speech-to-text software was used for transcription in this dictation.  Possible transcriptional errors can occur using Animal nutritionist.   Signed,  Ranny Bye. Asiel Chrostowski, MD   Outpatient Encounter Medications as of 09/24/2023  Medication Sig   albuterol  (VENTOLIN  HFA) 108 (90 Base) MCG/ACT inhaler INHALE 2 PUFFS INTO THE LUNGS EVERY 6 HOURS AS NEEDED FOR WHEEZING   Alcohol Swabs (ALCOHOL PREP) 70 % PADS Apply topically.   atorvastatin  (LIPITOR) 40 MG tablet Take 1 tablet (40 mg total) by mouth daily.   cetirizine (ZYRTEC)  10 MG tablet Take 10 mg by mouth daily as needed for allergies.   ferrous sulfate 325 (65 FE) MG EC tablet Take 1 tablet by mouth daily with breakfast.   fluticasone  (FLONASE ) 50 MCG/ACT nasal spray Place 2 sprays into the nose daily as needed.   glucose blood (PRECISION QID TEST) test strip 1 each by Other route in the morning, at noon, and at bedtime.   hydrochlorothiazide  (HYDRODIURIL ) 12.5 MG tablet Take 1 tablet (12.5 mg total) by mouth daily.   levothyroxine (SYNTHROID) 100 MCG tablet Take 100 mcg by mouth daily before breakfast.   metFORMIN  (GLUCOPHAGE -XR) 500 MG 24 hr tablet Take 1 tablet (500 mg total) by mouth daily with breakfast.   Multiple Vitamin (MULTIVITAMIN) tablet Take 1 tablet by mouth daily.   omeprazole  (PRILOSEC) 20 MG capsule TAKE 2 CAPSULES BY MOUTH DAILY   ondansetron (ZOFRAN) 8 MG tablet Take 1 tablet by mouth every 8 (eight) hours as needed.   tadalafil (CIALIS) 5 MG tablet Take 5 mg by mouth every other day.   tamsulosin  (FLOMAX ) 0.4 MG CAPS capsule TAKE 1 CAPSULE(0.4 MG) BY MOUTH DAILY   [DISCONTINUED] aspirin 81 MG tablet Take 81 mg by mouth daily. (Patient not taking: Reported on 12/12/2022)   [DISCONTINUED] Cinnamon 500 MG capsule Take 500 mg by mouth daily. (Patient not taking: Reported on 12/12/2022)   [DISCONTINUED] TRUEPLUS 5-BEVEL PEN NEEDLES 31G X 5 MM MISC    No facility-administered encounter medications on file as of 09/24/2023.

## 2023-09-24 ENCOUNTER — Ambulatory Visit (INDEPENDENT_AMBULATORY_CARE_PROVIDER_SITE_OTHER): Admitting: Family Medicine

## 2023-09-24 ENCOUNTER — Encounter: Payer: Self-pay | Admitting: Family Medicine

## 2023-09-24 VITALS — BP 130/70 | HR 60 | Temp 98.1°F | Ht 71.0 in | Wt 245.4 lb

## 2023-09-24 DIAGNOSIS — Z803 Family history of malignant neoplasm of breast: Secondary | ICD-10-CM | POA: Diagnosis not present

## 2023-09-24 DIAGNOSIS — C179 Malignant neoplasm of small intestine, unspecified: Secondary | ICD-10-CM

## 2023-09-24 DIAGNOSIS — C787 Secondary malignant neoplasm of liver and intrahepatic bile duct: Secondary | ICD-10-CM

## 2023-09-24 DIAGNOSIS — N6341 Unspecified lump in right breast, subareolar: Secondary | ICD-10-CM | POA: Diagnosis not present

## 2023-09-24 DIAGNOSIS — N644 Mastodynia: Secondary | ICD-10-CM

## 2023-09-24 LAB — PROLACTIN: Prolactin: 4.9 ng/mL (ref 2.0–18.0)

## 2023-09-24 LAB — FSH/LH
FSH: 14.9 m[IU]/mL — ABNORMAL HIGH (ref 1.4–12.8)
LH: 6.2 m[IU]/mL (ref 1.6–15.2)

## 2023-09-24 NOTE — Patient Instructions (Signed)
Please read handouts on Plantar Fascitis.  STRETCHING and Strengthening program critically important.  Strengthening on foot and calf muscles as seen in handout. Calf raises, 2 legged, then 1 legged. Foot massage with tennis ball. Ice massage.  Towel Scrunches: get a towel or hand towel, use toes to pick up and scrunch up the towel.  Marble pick-ups, practice picking up marbles with toes and placing into a cup  NEEDS TO BE DONE EVERY DAY  Recommended over the counter insoles. (Spenco or Hapad)  A rigid shoe with good arch support helps: Dansko (great), Keen, Merrell No easily bendable shoes.   Tuli's heel cups  

## 2023-09-26 ENCOUNTER — Ambulatory Visit: Payer: Self-pay | Admitting: Family Medicine

## 2023-09-26 ENCOUNTER — Encounter: Payer: Self-pay | Admitting: Family Medicine

## 2023-09-26 DIAGNOSIS — R5383 Other fatigue: Secondary | ICD-10-CM

## 2023-09-26 DIAGNOSIS — E782 Mixed hyperlipidemia: Secondary | ICD-10-CM

## 2023-09-26 DIAGNOSIS — Z79899 Other long term (current) drug therapy: Secondary | ICD-10-CM

## 2023-09-26 DIAGNOSIS — R7989 Other specified abnormal findings of blood chemistry: Secondary | ICD-10-CM

## 2023-09-26 DIAGNOSIS — Z125 Encounter for screening for malignant neoplasm of prostate: Secondary | ICD-10-CM

## 2023-09-26 NOTE — Telephone Encounter (Signed)
 Can you check on his mammogram and ultrasound referral?  It was to an imaging group in [place].

## 2023-09-27 ENCOUNTER — Telehealth: Payer: Self-pay | Admitting: Family Medicine

## 2023-09-27 NOTE — Telephone Encounter (Signed)
 Copied from CRM [id number]. Topic: Clinical - Request for Lab/Test Order >> Sep 27, 2023  9:49 AM James Watts wrote: Reason for CRM: Patient needs mammogram order faxed to [facility] at [phone] as soon as possible. Says he's at stage 4 cancer and really wants to get this done. Please call him to let him know when it's faxed.

## 2023-09-27 NOTE — Telephone Encounter (Signed)
 Orders faxed as requested.  Rick notified of this via telephone.

## 2023-09-28 LAB — MM OUTSIDE FILMS MAMMO

## 2023-09-29 ENCOUNTER — Other Ambulatory Visit: Payer: Self-pay | Admitting: Family Medicine

## 2023-10-02 ENCOUNTER — Encounter: Payer: Self-pay | Admitting: Family Medicine

## 2023-10-09 ENCOUNTER — Other Ambulatory Visit: Payer: Self-pay | Admitting: Family Medicine

## 2023-11-15 ENCOUNTER — Other Ambulatory Visit: Payer: Self-pay | Admitting: Family Medicine

## 2023-11-21 ENCOUNTER — Other Ambulatory Visit: Payer: Self-pay | Admitting: Family Medicine

## 2023-12-10 ENCOUNTER — Telehealth: Payer: Self-pay | Admitting: *Deleted

## 2023-12-10 ENCOUNTER — Telehealth: Payer: Self-pay

## 2023-12-10 NOTE — Telephone Encounter (Signed)
-----   Message from Veva JINNY Ferrari sent at 12/10/2023  3:14 PM EDT ----- Regarding: Lab orders for Mon, 8.18.25 Patient is scheduled for CPX labs, please order future labs, Thanks , Veva

## 2023-12-10 NOTE — Telephone Encounter (Signed)
 We can just do everything at the time of his office visit.  I will do it.

## 2023-12-10 NOTE — Telephone Encounter (Signed)
 Copied from CRM [id number]. Topic: Appointments - Appointment Info/Confirmation >> Dec 10, 2023 12:57 PM Rosina BIRCH wrote: Patient/patient representative is calling for information regarding an appointment.   Patient called wanting to know what the medicare call is about that he is having on 8/11 and if it is needed.  [phone]

## 2023-12-10 NOTE — Telephone Encounter (Signed)
 Spoke with James Watts to let him know what the MWV was.  He would prefer not to do that appointment due to last time he got a bill for over $200.  Appointment cancelled as requested.

## 2023-12-10 NOTE — Telephone Encounter (Signed)
 Do you want James Watts to complete the MWV scheduled on 12/17/23 or are you okay doing everything at his CPE on 12/31/23?

## 2023-12-10 NOTE — Telephone Encounter (Signed)
 Future lab orders already in EPIC.

## 2023-12-24 ENCOUNTER — Other Ambulatory Visit (INDEPENDENT_AMBULATORY_CARE_PROVIDER_SITE_OTHER)

## 2023-12-24 DIAGNOSIS — E782 Mixed hyperlipidemia: Secondary | ICD-10-CM | POA: Diagnosis not present

## 2023-12-24 DIAGNOSIS — R7989 Other specified abnormal findings of blood chemistry: Secondary | ICD-10-CM

## 2023-12-24 DIAGNOSIS — Z125 Encounter for screening for malignant neoplasm of prostate: Secondary | ICD-10-CM

## 2023-12-24 DIAGNOSIS — Z79899 Other long term (current) drug therapy: Secondary | ICD-10-CM | POA: Diagnosis not present

## 2023-12-24 DIAGNOSIS — R5383 Other fatigue: Secondary | ICD-10-CM

## 2023-12-24 LAB — HEPATIC FUNCTION PANEL
ALT: 24 U/L (ref 0–53)
AST: 25 U/L (ref 0–37)
Albumin: 4.2 g/dL (ref 3.5–5.2)
Alkaline Phosphatase: 96 U/L (ref 39–117)
Bilirubin, Direct: 0.2 mg/dL (ref 0.0–0.3)
Total Bilirubin: 0.9 mg/dL (ref 0.2–1.2)
Total Protein: 6.7 g/dL (ref 6.0–8.3)

## 2023-12-24 LAB — CBC WITH DIFFERENTIAL/PLATELET
Basophils Absolute: 0 K/uL (ref 0.0–0.1)
Basophils Relative: 0.6 % (ref 0.0–3.0)
Eosinophils Absolute: 0.4 K/uL (ref 0.0–0.7)
Eosinophils Relative: 5.9 % — ABNORMAL HIGH (ref 0.0–5.0)
HCT: 45.7 % (ref 39.0–52.0)
Hemoglobin: 15.6 g/dL (ref 13.0–17.0)
Lymphocytes Relative: 21.6 % (ref 12.0–46.0)
Lymphs Abs: 1.6 K/uL (ref 0.7–4.0)
MCHC: 34.1 g/dL (ref 30.0–36.0)
MCV: 91.2 fl (ref 78.0–100.0)
Monocytes Absolute: 0.8 K/uL (ref 0.1–1.0)
Monocytes Relative: 10.4 % (ref 3.0–12.0)
Neutro Abs: 4.4 K/uL (ref 1.4–7.7)
Neutrophils Relative %: 61.5 % (ref 43.0–77.0)
Platelets: 136 K/uL — ABNORMAL LOW (ref 150.0–400.0)
RBC: 5.01 Mil/uL (ref 4.22–5.81)
RDW: 14.6 % (ref 11.5–15.5)
WBC: 7.2 K/uL (ref 4.0–10.5)

## 2023-12-24 LAB — LIPID PANEL
Cholesterol: 172 mg/dL (ref 0–200)
HDL: 41.3 mg/dL (ref >39.00)
LDL Cholesterol: 101 mg/dL — ABNORMAL HIGH (ref 0–99)
NonHDL: 130.51
Total CHOL/HDL Ratio: 4
Triglycerides: 149 mg/dL (ref 0.0–149.0)
VLDL: 29.8 mg/dL (ref 0.0–40.0)

## 2023-12-24 LAB — BASIC METABOLIC PANEL WITH GFR
BUN: 20 mg/dL (ref 6–23)
CO2: 33 meq/L — ABNORMAL HIGH (ref 19–32)
Calcium: 8.9 mg/dL (ref 8.4–10.5)
Chloride: 99 meq/L (ref 96–112)
Creatinine, Ser: 1.18 mg/dL (ref 0.40–1.50)
GFR: 62.52 mL/min (ref >60.00)
Glucose, Bld: 103 mg/dL — ABNORMAL HIGH (ref 70–99)
Potassium: 4.3 meq/L (ref 3.5–5.1)
Sodium: 140 meq/L (ref 135–145)

## 2023-12-24 LAB — TSH: TSH: 2.12 u[IU]/mL (ref 0.35–5.50)

## 2023-12-24 LAB — PSA, MEDICARE: PSA: 4.5 ng/mL — ABNORMAL HIGH (ref 0.10–4.00)

## 2023-12-24 LAB — HEMOGLOBIN A1C: Hgb A1c MFr Bld: 6.2 % (ref 4.6–6.5)

## 2023-12-26 ENCOUNTER — Other Ambulatory Visit: Payer: Self-pay | Admitting: Family Medicine

## 2023-12-30 ENCOUNTER — Encounter: Payer: Self-pay | Admitting: Family Medicine

## 2023-12-30 NOTE — Progress Notes (Signed)
 Bryden Darden T. Tiffannie Sloss, MD, CAQ Sports Medicine [facility] at [facility] [address] [place], [address]  Phone: [phone]  FAX: [phone]  James Watts - 68 y.o. male  MRN [id number]  Date of Birth: [birth year]  Date: 12/31/2023  PCP: Watt Mirza, MD  Referral: Watt Mirza, MD  No chief complaint on file.  Patient Care Team: Watt Mirza, MD as PCP - General (Family Medicine) Subjective:   James Watts is a 68 y.o. pleasant patient who presents for a medicare wellness examination:  Preventative Health Maintenance Visit:  Health Maintenance Summary Reviewed and updated, unless pt declines services.  Tobacco History Reviewed. Alcohol: No concerns, no excessive use Exercise Habits: Some activity, rec at least 30 mins 5 times a week STD concerns: no risk or activity to increase risk Drug Use: None  James Watts is a very nice patient who I have known for many years.  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  He has had stable hypertension, hyperlipidemia, as well as hypothyroidism.  Thyroid : No symptoms. Labs reviewed. Denies cold / heat intolerance, dry skin, hair loss. No goiter.  Lab Results  Component Value Date   TSH 2.12 12/24/2023     Health Maintenance  Topic Date Due   COVID-19 Vaccine (5 - 2024-25 season) 01/07/2023   Medicare Annual Wellness (AWV)  12/12/2023   INFLUENZA VACCINE  08/05/2024 (Originally 12/07/2023)   Colonoscopy  12/27/2028   DTaP/Tdap/Td (4 - Td or Tdap) 08/03/2030   Pneumococcal Vaccine: 50+ Years  Completed   Hepatitis C Screening  Completed   Zoster Vaccines- Shingrix  Completed   HPV VACCINES  Aged Out   Meningococcal B Vaccine  Aged Out    Discussed the use of AI scribe software for clinical note transcription with the patient, who gave verbal consent to proceed.  History of Present Illness     Immunization History  Administered Date(s) Administered    Influenza Split 03/22/2011, 02/13/2012   Influenza Whole 02/25/2007, 03/03/2008, 03/22/2009, 04/06/2010   Influenza, High Dose Seasonal PF 01/16/2019, 01/20/2020, 01/27/2021, 01/30/2023   Influenza,inj,Quad PF,6+ Mos 02/27/2013, 03/06/2014, 02/10/2015, 02/25/2016, 02/21/2017, 01/29/2018   Moderna Covid-19 Vaccine Bivalent Booster 11yrs & up 01/27/2021   PFIZER(Purple Top)SARS-COV-2 Vaccination 05/28/2019, 06/18/2019, 02/10/2020   Pneumococcal Conjugate-13 03/27/2019   Pneumococcal Polysaccharide-23 05/23/2021   Td 03/22/2009   Tdap 07/11/2012, 08/02/2020   Zoster Recombinant(Shingrix) 01/23/2019, 04/04/2019   Zoster, Live 03/17/2015    Patient Active Problem List   Diagnosis Date Noted   Duodenal cancer (HCC) 10/03/2022    Priority: High   Metastatic carcinoma to liver (HCC) 10/03/2022    Priority: High   Adenocarcinoma of small intestine, stage 4 (HCC) 08/18/2022    Priority: High   CKD (chronic kidney disease) stage 3, GFR 30-59 ml/min (HCC) 02/11/2020    Priority: Medium    HYPERLIPIDEMIA 02/25/2007    Priority: Medium    Essential hypertension 02/25/2007    Priority: Medium    Hypothyroidism, acquired 07/24/2023   Iron deficiency 10/17/2022   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 09/21/2022   H/O total ankle replacement, 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   Benign prostatic hyperplasia with urinary hesitancy 03/22/2009   GERD (gastroesophageal reflux disease) 02/25/2007    Past Medical History:  Diagnosis Date   Adenocarcinoma of small intestine, stage 4 (HCC) 08/18/2022   CKD (chronic kidney disease) stage 3, GFR 30-59 ml/min (HCC) 02/11/2020   GFR 70 October 2021  Duodenal cancer (HCC) 10/03/2022   GERD (gastroesophageal reflux disease) 02/25/2007   Qualifier: Diagnosis of  By: Jeanelle MD, Layman     H/O total ankle replacement, 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  Social History   Social History Narrative   Not on file    Past Medical History, Surgical History, Social History, Family History, Problem List, Medications, and Allergies have been reviewed and updated if relevant.  Review of Systems: Pertinent positives are listed above.  Otherwise, a full 14 point review of systems has been done in full and it is negative except where it is noted positive.  Objective:   There were no vitals taken for this visit.    05/23/2021    9:00 AM 09/19/2021    8:25 AM 09/04/2022    3:51 PM 12/11/2022   11:01 AM 09/24/2023    9:41 AM  Fall Risk  Falls in the past year? 0 0 0 0 0  Was there an injury with Fall?  0 0 0 0  Fall Risk Category Calculator  0 0 0 0  Fall Risk Category (Retired)  Low      (RETIRED) Patient Fall Risk Level  Low fall risk      Patient at Risk for Falls Due to  No Fall Risks No Fall Risks No Fall Risks No Fall Risks  Fall risk Follow up  Falls evaluation completed  Falls evaluation completed Falls evaluation completed;Falls prevention discussed Falls evaluation completed     Data saved with a previous flowsheet row definition   Ideal Body Weight:   No results found.    09/24/2023    9:41 AM 12/12/2022    8:55 AM 09/04/2022    3:54 PM 09/19/2021    8:21 AM 05/23/2021    9:00 AM  Depression screen PHQ 2/9  Decreased Interest 0 0 0 0 0  Down, Depressed, Hopeless 0 0 2 1 0  PHQ - 2 Score 0 0 2 1 0  Altered sleeping   2    Tired, decreased energy   1    Feeling bad or failure about yourself    0    Trouble concentrating   1    Moving slowly or  fidgety/restless   0    Suicidal thoughts   0    PHQ-9 Score   6    Difficult doing work/chores   Somewhat difficult       GEN: well developed, well nourished, no acute distress Eyes: conjunctiva and lids normal, PERRLA, EOMI ENT: TM clear, nares clear, oral exam WNL Neck: supple, no lymphadenopathy, no thyromegaly, no JVD Pulm: clear to auscultation and percussion, respiratory effort normal CV: regular rate and rhythm, S1-S2, no murmur, rub or gallop, no bruits, peripheral pulses normal and symmetric, no cyanosis, clubbing, edema or varicosities GI: soft, non-tender; no hepatosplenomegaly, masses; active bowel sounds all quadrants GU: deferred Lymph: no cervical, axillary or inguinal adenopathy MSK: gait normal, muscle tone and strength WNL,  no joint swelling, effusions, discoloration, crepitus  SKIN: clear, good turgor, color WNL, no rashes, lesions, or ulcerations Neuro: normal mental status, normal strength, sensation, and motion Psych: alert; oriented to person, place and time, normally interactive and not anxious or depressed in appearance.  All labs reviewed with patient.   Results for orders placed or performed in visit on 12/24/23  FSH/LH   Collection Time: 12/24/23 12:00 AM  Result Value Ref Range   FSH 13.0 (H) 1.4 - 12.8 mIU/mL   LH 4.9 1.6 - 15.2 mIU/mL  PSA, Medicare   Collection Time: 12/24/23  8:46 AM  Result Value Ref Range   PSA 4.50 (H) 0.10 - 4.00 ng/ml  Basic metabolic panel with GFR   Collection Time: 12/24/23  8:46 AM  Result Value Ref Range   Sodium 140 135 - 145 mEq/L   Potassium 4.3 3.5 - 5.1 mEq/L   Chloride 99 96 - 112 mEq/L   CO2 33 (H) 19 - 32 mEq/L   Glucose, Bld 103 (H) 70 - 99 mg/dL   BUN 20 6 - 23 mg/dL   Creatinine, Ser 8.81 0.40 - 1.50 mg/dL   GFR 37.47 >39.99 mL/min   Calcium  8.9 8.4 - 10.5 mg/dL  CBC with Differential/Platelet   Collection Time: 12/24/23  8:46 AM  Result Value Ref Range   WBC 7.2 4.0 - 10.5 K/uL   RBC 5.01 4.22 -  5.81 Mil/uL   Hemoglobin 15.6 13.0 - 17.0 g/dL   HCT 54.2 60.9 - 47.9 %   MCV 91.2 78.0 - 100.0 fl   MCHC 34.1 30.0 - 36.0 g/dL   RDW 85.3 88.4 - 84.4 %   Platelets 136.0 (L) 150.0 - 400.0 K/uL   Neutrophils Relative % 61.5 43.0 - 77.0 %   Lymphocytes Relative 21.6 12.0 - 46.0 %   Monocytes Relative 10.4 3.0 - 12.0 %   Eosinophils Relative 5.9 (H) 0.0 - 5.0 %   Basophils Relative 0.6 0.0 - 3.0 %   Neutro Abs 4.4 1.4 - 7.7 K/uL   Lymphs Abs 1.6 0.7 - 4.0 K/uL   Monocytes Absolute 0.8 0.1 - 1.0 K/uL   Eosinophils Absolute 0.4 0.0 - 0.7 K/uL   Basophils Absolute 0.0 0.0 - 0.1 K/uL  Hepatic function panel   Collection Time: 12/24/23  8:46 AM  Result Value Ref Range   Total Bilirubin 0.9 0.2 - 1.2 mg/dL   Bilirubin, Direct 0.2 0.0 - 0.3 mg/dL   Alkaline Phosphatase 96 39 - 117 U/L   AST 25 0 - 37 U/L   ALT 24 0 - 53 U/L   Total Protein 6.7 6.0 - 8.3 g/dL   Albumin 4.2 3.5 - 5.2 g/dL  Hemoglobin J8r   Collection Time: 12/24/23  8:46 AM  Result Value Ref Range   Hgb A1c MFr Bld 6.2 4.6 - 6.5 %  Lipid panel   Collection Time: 12/24/23  8:46 AM  Result Value Ref Range   Cholesterol 172 0 - 200 mg/dL   Triglycerides 850.9 0.0 - 149.0 mg/dL   HDL 58.69 >60.99 mg/dL   VLDL 70.1 0.0 - 59.9 mg/dL   LDL Cholesterol 898 (H) 0 - 99 mg/dL   Total CHOL/HDL Ratio 4    NonHDL 130.51   TSH   Collection Time: 12/24/23  8:46 AM  Result Value Ref Range   TSH 2.12 0.35 - 5.50 uIU/mL    Assessment and Plan:     ICD-10-CM   1. Healthcare maintenance  Z00.00  Assessment & Plan      Health Maintenance Exam: The patient's preventative maintenance and recommended screening tests for an annual wellness exam were reviewed in full today. Brought up to date unless services declined.  Counselled on the importance of diet, exercise, and its role in overall health and mortality. The patient's FH and SH was reviewed, including their home life, tobacco status, and drug and alcohol  status.  Follow-up in 1 year for physical exam or additional follow-up below.  I have personally reviewed the Medicare Annual Wellness questionnaire and have noted 1. The patient's medical and social history 2. Their use of alcohol, tobacco or illicit drugs 3. Their current medications and supplements 4. The patient's functional ability including ADL's, fall risks, home safety risks and hearing or visual             impairment. 5. Diet and physical activities 6. Evidence for depression or mood disorders 7. Reviewed Updated provider list, see scanned forms and [facility] Snapshot.  8. Reviewed whether or not the patient has HCPOA or living will, and discussed what this means with the patient.  Recommended he bring in a copy for his chart in [facility].  The patients weight, height, BMI and visual acuity have been recorded in the chart I have made referrals, counseling and provided education to the patient based review of the above and I have provided the pt with a written personalized care plan for preventive services.  I have provided the patient with a copy of your personalized plan for preventive services. Instructed to take the time to review along with their updated medication list.  Disposition: No follow-ups on file.  Future Appointments  Date Time Provider Department Center  12/31/2023  2:00 PM Abdoulie Tierce, Jacques, MD [facility]    No orders of the defined types were placed in this encounter.  There are no discontinued medications. No orders of the defined types were placed in this encounter.   Signed,  Jacques DASEN. Kerington Hildebrant, MD   Allergies as of 12/31/2023       Reactions   Cefaclor    REACTION: unspecified        Medication List        Accurate as of December 30, 2023  4:07 PM. If you have any questions, ask your nurse or doctor.          albuterol  108 (90 Base) MCG/ACT inhaler Commonly known as: VENTOLIN  HFA INHALE 2 PUFFS INTO THE LUNGS EVERY 6 HOURS AS NEEDED FOR  WHEEZING   Alcohol Prep 70 % Pads Apply topically.   atorvastatin  40 MG tablet Commonly known as: LIPITOR Take 1 tablet (40 mg total) by mouth daily.   cetirizine 10 MG tablet Commonly known as: ZYRTEC Take 10 mg by mouth daily as needed for allergies.   ferrous sulfate 325 (65 FE) MG EC tablet Take 1 tablet by mouth daily with breakfast.   fluticasone  50 MCG/ACT nasal spray Commonly known as: FLONASE  Place 2 sprays into the nose daily as needed.   hydrochlorothiazide  12.5 MG tablet Commonly known as: HYDRODIURIL  Take 1 tablet (12.5 mg total) by mouth daily.   levothyroxine 100 MCG tablet Commonly known as: SYNTHROID Take 100 mcg by mouth daily before breakfast.   metFORMIN  500 MG 24 hr tablet Commonly known as: GLUCOPHAGE -XR Take 1 tablet (500 mg total) by mouth daily with breakfast.   multivitamin tablet Take 1 tablet by mouth daily.   omeprazole  20 MG capsule Commonly known as: PRILOSEC TAKE 2  CAPSULES BY MOUTH DAILY   ondansetron 8 MG tablet Commonly known as: ZOFRAN Take 1 tablet by mouth every 8 (eight) hours as needed.   Precision QID Test test strip Generic drug: glucose blood 1 each by Other route in the morning, at noon, and at bedtime.   tadalafil 5 MG tablet Commonly known as: CIALIS Take 5 mg by mouth every other day.   tamsulosin  0.4 MG Caps capsule Commonly known as: FLOMAX  TAKE 1 CAPSULE(0.4 MG) BY MOUTH DAILY

## 2023-12-31 ENCOUNTER — Ambulatory Visit (INDEPENDENT_AMBULATORY_CARE_PROVIDER_SITE_OTHER): Admitting: Family Medicine

## 2023-12-31 ENCOUNTER — Encounter: Payer: Self-pay | Admitting: Family Medicine

## 2023-12-31 VITALS — BP 122/68 | HR 65 | Temp 98.6°F | Ht 70.75 in | Wt 251.5 lb

## 2023-12-31 DIAGNOSIS — M7052 Other bursitis of knee, left knee: Secondary | ICD-10-CM

## 2023-12-31 DIAGNOSIS — R972 Elevated prostate specific antigen [PSA]: Secondary | ICD-10-CM

## 2023-12-31 DIAGNOSIS — Z0000 Encounter for general adult medical examination without abnormal findings: Secondary | ICD-10-CM

## 2024-01-01 ENCOUNTER — Encounter: Payer: Self-pay | Admitting: Family Medicine

## 2024-01-03 LAB — TESTOS,TOTAL,FREE AND SHBG (FEMALE)
Free Testosterone: 43.1 pg/mL (ref 30.0–135.0)
Sex Hormone Binding: 58 nmol/L (ref 22–77)
Testosterone, Total, LC-MS-MS: 424 ng/dL (ref 250–1100)

## 2024-01-03 LAB — FSH/LH
FSH: 13 m[IU]/mL — ABNORMAL HIGH (ref 1.4–12.8)
LH: 4.9 m[IU]/mL (ref 1.6–15.2)

## 2024-01-04 ENCOUNTER — Ambulatory Visit: Payer: Self-pay | Admitting: Family Medicine

## 2024-01-16 ENCOUNTER — Telehealth: Payer: Self-pay | Admitting: Family Medicine

## 2024-01-16 NOTE — Telephone Encounter (Signed)
 Copied from CRM #[id number]. Topic: Referral - Status >> Jan 16, 2024  8:47 AM Aleatha BROCKS wrote: Reason for CRM: Patient is calling to see about the [facility] urology referral and he called for it to be  fax  over [phone]

## 2024-01-28 ENCOUNTER — Telehealth: Payer: Self-pay | Admitting: *Deleted

## 2024-01-28 NOTE — Telephone Encounter (Signed)
 Copied from CRM [id number]. Topic: Referral - Question >> Jan 28, 2024 12:42 PM Aisha D wrote: Reason for CRM: Pt stated that he contacted [facility] urology and they informed him that they don't have the referral as of yet. The referral was submitted on 8/25 but is still pending review. Pt would like to have the referral resubmitted to [facility] urology. Fax [phone].

## 2024-01-28 NOTE — Telephone Encounter (Signed)
 Please check on Mr. Edsall's Urology referral to [facility].  Referral made 8/25.

## 2024-01-30 ENCOUNTER — Encounter: Payer: Self-pay | Admitting: *Deleted

## 2024-01-30 NOTE — Telephone Encounter (Signed)
 This has been addressed in another telephone encounter.   Also, see referral notes for any updates needed.   This has been faxed to [facility]

## 2024-01-30 NOTE — Telephone Encounter (Signed)
 Referral faxed to [facility]. Patient contact via Mychart (letter and direct message) with referral information.   See referral notes

## 2024-02-15 ENCOUNTER — Other Ambulatory Visit: Payer: Self-pay | Admitting: Family Medicine

## 2024-04-11 DIAGNOSIS — E064 Drug-induced thyroiditis: Secondary | ICD-10-CM | POA: Insufficient documentation

## 2024-04-27 ENCOUNTER — Other Ambulatory Visit: Payer: Self-pay | Admitting: Family Medicine

## 2024-05-21 ENCOUNTER — Other Ambulatory Visit: Payer: Self-pay | Admitting: Family Medicine

## 2024-05-21 ENCOUNTER — Encounter: Payer: Self-pay | Admitting: Family Medicine

## 2024-05-21 MED ORDER — HYDROCHLOROTHIAZIDE 12.5 MG PO TABS: 12.5000 mg | ORAL_TABLET | Freq: Every day | ORAL | 0 refills | Status: DC

## 2024-05-21 MED ORDER — ATORVASTATIN CALCIUM 40 MG PO TABS: 40.0000 mg | ORAL_TABLET | Freq: Every day | ORAL | 0 refills | Status: AC

## 2024-05-21 MED ORDER — METFORMIN HCL ER 500 MG PO TB24: 500.0000 mg | ORAL_TABLET | Freq: Every day | ORAL | 0 refills | Status: AC

## 2024-05-21 MED ORDER — OMEPRAZOLE 20 MG PO CPDR: 40.0000 mg | DELAYED_RELEASE_CAPSULE | Freq: Every day | ORAL | 0 refills | Status: AC

## 2024-05-21 MED ORDER — TAMSULOSIN HCL 0.4 MG PO CAPS: 0.4000 mg | ORAL_CAPSULE | Freq: Every day | ORAL | 0 refills | Status: AC

## 2024-05-21 NOTE — Telephone Encounter (Signed)
 Copied from CRM [id number]. Topic: Clinical - Medication Refill >> May 21, 2024  7:58 AM Mia F wrote: Medication: PT LEFT HIS MEDS IN [place] AND NEED A 5 DAY SUPPLY    atorvastatin  (LIPITOR) 40 MG tablet metFORMIN  (GLUCOPHAGE -XR) 500 MG 24 hr tablet tamsulosin  (FLOMAX ) 0.4 MG CAPS capsule hydrochlorothiazide  (HYDRODIURIL ) 12.5 MG tablet omeprazole  (PRILOSEC) 20 MG capsule   Has the patient contacted their pharmacy? Yes (Agent: If no, request that the patient contact the pharmacy for the refill. If patient does not wish to contact the pharmacy document the reason why and proceed with request.) (Agent: If yes, when and what did the pharmacy advise?)  This is the patient's preferred pharmacy:  [id number] - [place], [place] - [address] AT [facility] [address] [place] [address] Phone: [phone] Fax: [phone]  Is this the correct pharmacy for this prescription? Yes If no, delete pharmacy and type the correct one.   Has the prescription been filled recently? Yes  Is the patient out of the medication? Yes  Has the patient been seen for an appointment in the last year OR does the patient have an upcoming appointment? Yes  Can we respond through MyChart? Yes  Agent: Please be advised that Rx refills may take up to 3 business days. We ask that you follow-up with your pharmacy.

## 2024-05-23 ENCOUNTER — Ambulatory Visit: Payer: Self-pay

## 2024-05-23 NOTE — Telephone Encounter (Signed)
 James Watts notified as instructed by telephone.  Patient states understanding.  Medication list updated.

## 2024-05-23 NOTE — Telephone Encounter (Signed)
 FYI Only or Action Required?: Action required by provider: update on patient condition.  Patient was last seen in primary care on 12/31/2023 by Watt Mirza, MD.  Called Nurse Triage reporting Hypertension.  Symptoms began today.  Interventions attempted: Prescription medications: Hydrochlorothiazide .  Symptoms are: stable.  Triage Disposition: See PCP When Office is Open (Within 3 Days)  Patient/caregiver understands and will follow disposition?: No, wishes to speak with PCP            Message from [place] S sent at 05/23/2024 10:25 AM EST  Summary: BP [place]   Reason for Triage: Elevated BP; last reading 176/104 not sure if its due to recent scan or if he needs to increase blood pressure medication dosage.      Reason for Disposition  Systolic BP >= 160 OR Diastolic >= 100  Answer Assessment - Initial Assessment Questions 1. BLOOD PRESSURE: What is your blood pressure? Did you take at least two measurements 5 minutes apart?     176/104-170/93  2. ONSET: When did you take your blood pressure?     This morning   3. HOW: How did you take your blood pressure? (e.g., automatic home BP monitor, visiting nurse)     [facility]   4. HISTORY: Do you have a history of high blood pressure?      HTN   5. MEDICINES: Are you taking any medicines for blood pressure? Have you missed any doses recently?     Hydrochlorothiazide , missed a dose x2 days ago, now back on track  6. OTHER SYMPTOMS: Do you have any symptoms? (e.g., blurred vision, chest pain, difficulty breathing, headache, weakness)     No      Patient called in to triage with complaints of HTN. This has been ongoing for one day.  The patient stated he is currently at the infusion center and received these readings of 176/104-170/93. He is asymptomatic during triage call.    Appointment offered for further evaluation; he declined an appointment if it cannot be today. Per  Epic there is no availability for today, virtual or in office. He stated tomorrow he is going to [place] until mid March and would like the provider to be aware and get his thoughts. Please advise.  Protocols used: Blood Pressure - High-A-AH

## 2024-06-07 ENCOUNTER — Encounter: Payer: Self-pay | Admitting: Family Medicine

## 2024-06-10 MED ORDER — HYDROCHLOROTHIAZIDE 25 MG PO TABS: 25.0000 mg | ORAL_TABLET | Freq: Every day | ORAL | 3 refills | Status: AC
